# Patient Record
Sex: Female | Born: 1975 | Race: Black or African American | Hispanic: No | Smoking: Former smoker
Health system: Southern US, Community
[De-identification: ages and names within clinical notes are randomized; demographics above are authoritative.]

## PROBLEM LIST (undated history)

## (undated) DIAGNOSIS — J45909 Unspecified asthma, uncomplicated: Secondary | ICD-10-CM

## (undated) DIAGNOSIS — L309 Dermatitis, unspecified: Secondary | ICD-10-CM

## (undated) DIAGNOSIS — U071 COVID-19: Secondary | ICD-10-CM

## (undated) HISTORY — DX: Dermatitis, unspecified: L30.9

## (undated) HISTORY — DX: Unspecified asthma, uncomplicated: J45.909

---

## 1998-08-23 ENCOUNTER — Inpatient Hospital Stay (HOSPITAL_COMMUNITY): Admission: AD | Admit: 1998-08-23 | Discharge: 1998-08-23 | Payer: Self-pay | Admitting: *Deleted

## 1998-08-25 ENCOUNTER — Ambulatory Visit (HOSPITAL_COMMUNITY): Admission: RE | Admit: 1998-08-25 | Discharge: 1998-08-25 | Payer: Self-pay | Admitting: *Deleted

## 1999-12-14 ENCOUNTER — Other Ambulatory Visit: Admission: RE | Admit: 1999-12-14 | Discharge: 1999-12-14 | Payer: Self-pay | Admitting: Obstetrics and Gynecology

## 2000-06-27 ENCOUNTER — Encounter (INDEPENDENT_AMBULATORY_CARE_PROVIDER_SITE_OTHER): Payer: Self-pay | Admitting: Specialist

## 2000-06-27 ENCOUNTER — Inpatient Hospital Stay (HOSPITAL_COMMUNITY): Admission: AD | Admit: 2000-06-27 | Discharge: 2000-07-01 | Payer: Self-pay | Admitting: Obstetrics and Gynecology

## 2001-03-07 ENCOUNTER — Other Ambulatory Visit: Admission: RE | Admit: 2001-03-07 | Discharge: 2001-03-07 | Payer: Self-pay | Admitting: Obstetrics and Gynecology

## 2002-03-07 ENCOUNTER — Other Ambulatory Visit: Admission: RE | Admit: 2002-03-07 | Discharge: 2002-03-07 | Payer: Self-pay | Admitting: Obstetrics and Gynecology

## 2003-02-07 ENCOUNTER — Other Ambulatory Visit: Admission: RE | Admit: 2003-02-07 | Discharge: 2003-02-07 | Payer: Self-pay | Admitting: Obstetrics and Gynecology

## 2005-01-21 ENCOUNTER — Other Ambulatory Visit: Admission: RE | Admit: 2005-01-21 | Discharge: 2005-01-21 | Payer: Self-pay | Admitting: Obstetrics and Gynecology

## 2005-08-15 ENCOUNTER — Inpatient Hospital Stay (HOSPITAL_COMMUNITY): Admission: AD | Admit: 2005-08-15 | Discharge: 2005-08-15 | Payer: Self-pay | Admitting: *Deleted

## 2005-08-17 ENCOUNTER — Inpatient Hospital Stay (HOSPITAL_COMMUNITY): Admission: AD | Admit: 2005-08-17 | Discharge: 2005-08-20 | Payer: Self-pay | Admitting: Obstetrics and Gynecology

## 2005-08-18 ENCOUNTER — Encounter (INDEPENDENT_AMBULATORY_CARE_PROVIDER_SITE_OTHER): Payer: Self-pay | Admitting: Specialist

## 2005-08-27 ENCOUNTER — Inpatient Hospital Stay (HOSPITAL_COMMUNITY): Admission: AD | Admit: 2005-08-27 | Discharge: 2005-08-30 | Payer: Self-pay | Admitting: Obstetrics and Gynecology

## 2006-09-09 ENCOUNTER — Encounter: Admission: RE | Admit: 2006-09-09 | Discharge: 2006-09-09 | Payer: Self-pay | Admitting: Obstetrics and Gynecology

## 2008-08-09 HISTORY — PX: TENDON REPAIR: SHX5111

## 2009-01-31 ENCOUNTER — Emergency Department (HOSPITAL_COMMUNITY): Admission: EM | Admit: 2009-01-31 | Discharge: 2009-01-31 | Payer: Self-pay | Admitting: Emergency Medicine

## 2010-10-25 ENCOUNTER — Inpatient Hospital Stay (INDEPENDENT_AMBULATORY_CARE_PROVIDER_SITE_OTHER)
Admission: RE | Admit: 2010-10-25 | Discharge: 2010-10-25 | Disposition: A | Discharge status: Home / Self Care | Payer: 59 | Source: Ambulatory Visit | Attending: Family Medicine | Admitting: Family Medicine

## 2010-10-25 DIAGNOSIS — J069 Acute upper respiratory infection, unspecified: Secondary | ICD-10-CM

## 2010-11-16 LAB — POCT I-STAT 4, (NA,K, GLUC, HGB,HCT)
Glucose, Bld: 104 mg/dL — ABNORMAL HIGH (ref 70–99)
Potassium: 3.7 mEq/L (ref 3.5–5.1)
Sodium: 138 mEq/L (ref 135–145)

## 2010-12-22 NOTE — Op Note (Signed)
NAMEKATEENA, DEGROOTE                ACCOUNT NO.:  [id number]   MEDICAL RECORD NO.:  [id number]          PATIENT TYPE:  EMS   LOCATION:  MAJO                         FACILITY:  [facility]   PHYSICIAN:  Tennis Must Meyerdierks, M.D.DATE OF BIRTH:  Jun 15, 1976   DATE OF PROCEDURE:  01/31/2009  DATE OF DISCHARGE:  01/31/2009                               OPERATIVE REPORT   PREOPERATIVE DIAGNOSIS:  Laceration of right ring and long finger  extensor tendons, dorsum of the hand.   POSTOPERATIVE DIAGNOSES:  Laceration of extensor tendon, right long  finger with partial laceration of extensor tendon of right ring finger,  dorsum of the hand.   PROCEDURE:  Repair of extensor tendon of right long and ring fingers,  dorsum of the hand.   SURGEON:  Lowell Bouton, MD   ANESTHESIA:  General.   OPERATIVE FINDINGS:  The patient had a complete transection of the [facility]  of the long finger overlying the metacarpus.  The ring finger had 2  extensor tendons and one was lacerated 70% of the way through and the  other was lacerated 50% of the way through.   PROCEDURE:  Under general anesthesia with a tourniquet on the right arm,  the right hand was prepped and draped in usual fashion.  After elevating  the limb, the tourniquet was inflated to 250 mmHg.  The previous sutures  were removed, and the wound was copiously irrigated.  It was extended  distally with a knife.  Blunt dissection was carried down into the  subcutaneous tissues and the extensor tendons were identified.  The  proximal ends of the tendon of the long finger was easily identified and  a 3-0 Ethibond Kessler suture was inserted.  The long finger was then  extended and the distal end was identified.  The repair was performed  using a 3-0 Ethibond Kessler augmented with 4 simple sutures.  The ring  finger extensor tendon was then examined and had 2 slips.  The radial  slip was transected 70% of the way through and so a 3-0 Ethibond  Kessler  suture was inserted.  The ulnar slip was lacerated 50% of the way  through and so was repaired with a 3-0 Ethibond simple sutures.  The  wound was irrigated copiously with saline.  The skin was closed with 4-0  nylon sutures.  Marcaine 0.5% was placed in the skin edges for pain  control.  Sterile dressings were applied followed by a volar splint with  the fingers extended and the wrist extended.  The patient had the  tourniquet released with good circulation of the hand.  She went to the  recovery room awake, stable, and in good condition.      Lowell Bouton, M.D.  Electronically Signed     EMM/MEDQ  D:  01/31/2009  T:  02/01/2009  Job:  [id number]

## 2010-12-25 NOTE — Discharge Summary (Signed)
NAMEMARIJANE, [name]               ACCOUNT NO.:  [id number]   MEDICAL RECORD NO.:  [id number]           PATIENT TYPE:   LOCATION:                                 FACILITY:   PHYSICIAN:  Naima A. Dillard, M.D.      DATE OF BIRTH:   DATE OF ADMISSION:  08/17/2005  DATE OF DISCHARGE:  08/20/2005                                 DISCHARGE SUMMARY   ADMITTING DIAGNOSES:  1.  Intrauterine pregnancy at 40 weeks.  2.  Spontaneous rupture of membranes.  3.  Previous cesarean section with desire for vaginal birth after cesarean.  4.  Positive group B Strep.  5.  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   DISCHARGE DIAGNOSES:  1.  Intrauterine pregnancy at 40 weeks.  2.  Spontaneous rupture of membranes.  3.  Previous cesarean section with desire for vaginal birth after cesarean.  4.  Positive group B Strep.  5.  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  6.  Vaginal birth after cesarean of a viable female infant named Gerilyn Pilgrim      weighing 6 pounds 8 ounces, Apgars 9 and 9.  7.  Left periurethral first degree laceration.  8.  Status post bilateral tubal ligation.   HOSPITAL PROCEDURES:  1.  Maternal fetal monitoring.  2.  Spontaneous vaginal delivery (vaginal birth after cesarean).  3.  Repair of first degree laceration.  4.  Postpartum bilateral tubal ligation.  5.  Epidural.  6.  Pitocin augmentation of labor.   HOSPITAL COURSE:  Patient was admitted with ruptured membranes and Pitocin  augmentation of labor.  Patient progressed throughout the night and  developed some variable decelerations which were treated with Amnioinfusion  with success.  Patient progressed to complete dilation and pushed well to  VBAC, female infant weighing 6 pounds 8 ounces, Apgars 9 and 9.  First degree  laceration was repaired.  Patient was requesting a bilateral tubal ligation  which was performed the next morning under epidural anesthesia by Dr. Estanislado Pandy  with no complications.  On postpartum day #1  patient was doing well, up and  about, tolerating foods, and breast-feeding the baby.  On postpartum day #2  patient was ready to go home.  She was breast-feeding well.  Pain was well  controlled.  She was afebrile and vital signs were stable.  Chest was clear.  Heart rate regular rate and rhythm.  Abdomen soft and appropriately tender.  Incision was clean, dry, and intact.  Lochia was small.  Extremities within  normal limits.  She was deemed to have received full benefit of her hospital  stay, was discharged home.   DISCHARGE MEDICATIONS:  1.  Motrin 600 mg p.o. q.6h. p.r.n.  2.  Tylox one to two p.o. q.4h. p.r.n.   DISCHARGE LABORATORIES:  White blood cell count 9, hemoglobin 12.5,  platelets 177.   DISCHARGE INSTRUCTIONS:  Per [facility] handout.   DISCHARGE FOLLOW-UP:  Six weeks or p.r.n.      Marie L. Williams, C.N.M.      Naima A. Normand Sloop, M.D.  Electronically Signed  MLW/MEDQ  D:  08/20/2005  T:  08/20/2005  Job:  [id number]

## 2010-12-25 NOTE — Discharge Summary (Signed)
[facility]  Patient:    [name]                        MRN: [id number] Adm. Date:  [id number] Disc. Date: [id number] Attending:  Shaune Spittle Dictator:   Miguel Dibble, C.N.M.                           Discharge Summary  ADMISSION DIAGNOSES:          1. Intrauterine pregnancy at term.                               2. Spontaneous rupture of membranes.                               3. Negative Group beta strep.                               4. Early labor.  DISCHARGE DIAGNOSES:          1. Nonreassuring fetal heart tones.                               2. Prolonged rupture of membranes.                               3. Delivered viable baby boy by cesarean                                  section, Apgars 3 and 9.  PROCEDURES:                   1. Epidural anesthesia.                               2. Internal fetal monitoring.                               3. Pitocin augmentation.                               4. Amnioinfusion.  COURSE OF HOSPITALIZATION:    Upon admission on November 19 at approximately 9:30 a.m. [name] was admitted after having ruptured five hours earlier. At her last office examination her cervix was one cm and 50% effaced. She was contracting mildly. By 1315 her cervix was 1.5 cm, 70% to 80% effaced, vertex at -2. Her forebag was ruptured. Pitocin augmentation was discussed an initiated at 1615 for inadequate contractions. By 5784 her cervix had progressed to 3 cm, 90% effaced, vertex at -1. Fetal heart rate was reassuring with a negative CST and contractions every three to four minutes. At 2200 hours she had progressed to 4 cm, 90% effaced, vertex at -1 with small accelerations and several early decelerations, occasionally late in timing. A fetal scalp electrode and IUPC were placed indicating 190 [place] units per 10 minutes. Cervix was 4 cm. At 0020 she  had progressed to 4 to 5 cm, 90% effaced,  vertex at -1, 200 to 220 [place] units. Fetal heart rate was nonreactive after Stadol but no decelerations. By 1:30 a.m. Pitocin was turned off for several variable decelerations down to the 100s and an episode of repetitive lates for 15 minutes that recovered with oxygen and IV bolus. Her cervix remained 5 cm, 90% effaced, vertex at -1. Fetal scalp stimulation was applied with reactivity. At 2:30 a.m. late decelerations had resolved; however, there was little long-term variability. An amnioinfusion had already been performed. The uterine baseline had increased between contractions. By 4:45 a.m. resting tone had improved but contractions continued. Fetal heart rate was in the 150s to 160s with subtle decelerations. Her cervix remained 5 cm, was swollen, and vertex at -1 despite Pitocin augmentation. After much discussion with the patient and her partner, it was decided we ought to proceed with a cesarean section. When fetal heart rate deteriorated it was persistent lates and resting tone between contractions rising. She had a viable baby boy, [name], Apgars 3 and 9, weighing 7 pounds, by cesarean section, lower segment transverse incision under epidural anesthesia without complications.  On November 21, postop day #1, she was breast-feeding, voiding, desired Depo-Provera for contraception, was afebrile. Hemoglobin dropped from 13.1 to 9.9. Incision was clean, dry, and intact.  On postop day #2 she was afebrile, ambulating well, tolerating her regular diet, breast-feeding well.  On postop day #3, November 23, she continues to breast-feed well, incision is clean, dry, and intact, lochia scant, fundus firm. She was discharged home in stable condition after her injection of Depo-Provera.  DISCHARGE INSTRUCTIONS:       Discharge instructions per [facility] booklet.  DISCHARGE MEDICATIONS:        Motrin, Tylox, prenatal vitamins, over-the-counter iron. Depo-Provera to be  given in the hospital.  DISCHARGE FOLLOW-UP:          Discharge follow-up in six weeks at [facility]. DD:  07/02/00 TD:  07/02/00 Job: [id number]
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
## 2010-12-25 NOTE — Op Note (Signed)
[facility]  Patient:    [name], [name]                        MRN: [id number] Proc. Date: 06/28/00 Adm. Date:  [id number] Disc. Date: [id number] Attending:  Dierdre Forth Pearline                           Operative Report  PREOPERATIVE DIAGNOSES:       1. Prolonged rupture of membranes.                               2. Fetal late decelerations.  POSTOPERATIVE DIAGNOSES:      1. Prolonged rupture of membranes.                               2. Fetal late decelerations.  SURGEON:                      Cecilio Asper, M.D.  ASSISTANT:                    Wynelle Bourgeois, CNM  PROCEDURE:                    Low transverse cesarean delivery.  ANESTHESIA:                   Epidural.  ESTIMATED BLOOD LOSS:         600 cc.  URINE OUTPUT:                 175 cc.  FINDINGS:                     Viable female infant named Jahri, Apgars 3 and 9, weighing 7 pounds.  Normal tubes and ovaries.  Placenta to pathology.  COMPLICATIONS:                None.  HISTORY:                      The patient is a 35 year old gravida 3, para 0, who presented at 49 weeks with spontaneous rupture of membranes on June 27, 2000.  The patient was augmented with Pitocin and progressed to 4-5 cm of dilatation.  Following epidural anesthesia, the patient was noted to have late decelerations.  The lates spontaneously resolved status post amnioinfusion. Contraction baseline had increased; therefore, one dose of subcutaneous terbutaline was given after Pitocin and amnioinfusion were discontinued to allow uterus to rest appropriately.  Once the resting time improved, the contractions were noted to continue.  The fetal heart tracing sustained persistent late decelerations after reinstitution of Pitocin.  The patient was therefore consented for a cesarean delivery.  DESCRIPTION OF PROCEDURE:     After adequate level of epidural anesthesia was obtained, the patient was  prepped and draped in a sterile fashion.  Low transverse incision was made down to the subcutaneous fat to the fascia. Fascia was incised on either side of the midline.  Kocher clamps were placed on the superior edge of the incision and the fascia was removed from the rectus muscles, both superiorly and inferiorly.  Parietal peritoneum was grasped and incised with the Metzenbaum scissors, and this incision was extended bluntly.  Bladder  piece was placed inside of the incision.  The uterus was entered in a low transverse cesarean fashion and extended laterally in both directions with the bandage scissors.  The infants head was elevated with fundal pressure.  The infant was delivered and taken to the warmer where it was cared for by the pediatric team.  Cord blood was obtained.  Placenta was manually extracted.  Uterus was removed from the pelvic abdominal region and covered with a wet lap sponge.  Tubes and ovaries were noted to be normal. The uterus was wiped clean with the wet lap sponge.  The uterine incision was grasped with spring clamps and reapproximated with a running suture of 0 Vicryl, second embrocating layer of 0 Vicryl was used with good hemostatic result.  The uterus was then replaced inside the incision.  Pelvis was irrigated.  Parietal peritoneum was mainly reapproximated.  The fascia was reapproximated from lateral edge of the midline with a running suture of #1 Vicryl.  Subcuticular layer was irrigated.  Skin was closed with skin staples. The patient tolerated the procedure well.  She was taken to recovery room in stable condition. DD:  09/06/00 TD:  09/06/00 Job: [id number]
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
## 2010-12-25 NOTE — Discharge Summary (Signed)
[name], [name]               ACCOUNT NO.:  [id number]   MEDICAL RECORD NO.:  [id number]          PATIENT TYPE:  INP   LOCATION:  9319                          [facility]ACILITY:  [facility]   PHYSICIAN:  [name], M.D.DATE OF BIRTH:  12/14/1975   DATE OF ADMISSION:  08/27/2005  DATE OF DISCHARGE:  08/30/2005                                 DISCHARGE SUMMARY   ADMITTING DIAGNOSES:  1.  Ten days postpartum status post vaginal birth after cesarean.  2.  Preeclampsia.   DISCHARGE DIAGNOSES:  1.  Postpartum x12 days.  2.  Resolving preeclampsia.   PROCEDURES:  Magnesium sulfate therapy.   HOSPITAL COURSE:  [name] is a 35 year old gravida 4, para 2-0-2-2, who  was seen in the office on August 27, 2005 with a report from the smart-  start nurse of blood pressures in the 140s/100s.  This was confirmed in the  office.  She also had proteinuria on a voided specimen.  She denied any  other symptoms.  She was sent to Maternity Admissions where she was noted to  have 30 mg of protein on a cath specimen, a slightly elevated SGPT and uric  acid of 6.8.  Her pressures in Maternity Admissions were in the 150s-  160s/101-110.  She was therefore admitted for magnesium sulfate therapy and  a 24-hour urine.  SGOT and SGPT had come down some after 24 hours.  Her  blood pressures were improving with diastolics from 75 to 100.  She had a 24-  hour urine showing greater than 1,000 mg of protein.  Repeat labs were  normal.  By August 30, 2005, the patient was doing well.  Her blood  pressures were in the 130s/88-99.  Her weight was 123, which was down two to  three pounds from admission.  Her physical exam was within normal limits.  Her urine output was good.  She denied any headache, visual symptoms or  epigastric pain.  Her postpartum recovery was continuing to go well.  She  was deemed to have received the full benefit of her hospital stay and was  discharged home per consult with Dr. Marline Gould.   DISCHARGE INSTRUCTIONS:  The patient is monitor for PIH precautions.  She is  also to increase her rest this week.  Routine postpartum instructions will  also be continued.   DISCHARGE MEDICATIONS:  Prenatal vitamins one p.o. daily.   Discharge follow up will occur on September 03, 2005 at 12 noon at [facility] in the office with [name], C.N.M. for a postpartum blood  pressure check or p.r.n.      [name], C.N.M.      [name], M.D.  Electronically Signed    VLL/MEDQ  D:  08/30/2005  T:  08/30/2005  Job:  [id number]
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
## 2010-12-25 NOTE — Op Note (Signed)
[name], [name]               ACCOUNT NO.:  [id number]   MEDICAL RECORD NO.:  [id number]          PATIENT TYPE:  INP   LOCATION:  9138                          FACILITY:  [facility]   PHYSICIAN:  Crist Fat. Rivard, M.D. DATE OF BIRTH:  Jan 14, 1976   DATE OF PROCEDURE:  08/18/2005  DATE OF DISCHARGE:                                 OPERATIVE REPORT   PREOPERATIVE DIAGNOSIS:  Desire for sterilization.   POSTOPERATIVE DIAGNOSIS:  Desire for sterilization.   ANESTHESIA:  Epidural.   PROCEDURE:  Postpartum bilateral tubal ligation.   SURGEON:  Crist Fat. Rivard, M.D.   ESTIMATED BLOOD LOSS:  Minimal.   PROCEDURE:  After being informed of the planned procedure with possible  complications including bleeding, infection and injury to other organs as  well as irreversibility and failure rate of one in 500, informed consent is  obtained.  The patient is taken to OR #1 and preexisting epidural anesthesia  is reinforced.  The patient is then placed in a dorsal decubitus position,  prepped and draped in a sterile fashion and her bladder is emptied with an  in-and-out Foley catheter.  After assessing adequate level of anesthesia,  the umbilical area is infiltrated with Marcaine 0.25% 10 mL, and we perform  a semielliptical incision at the umbilicus.  This is brought down bluntly to  the fascia, fascia is identified and grasped with Kocher forceps and is  incised with Mayo scissors.  The peritoneum is identified and entered  bluntly.  We are then able to easily locate each tube, which is exteriorized  until the fimbriae are identified.  The mesosalpinx is entered with cautery.  The tube is doubly ligated in the proximal and distal stump with 0 chromic.  A section of 1.5 cm is excised and both stumps are cauterized.  Hemostasis  is adequate.  The fascia is then closed with a running suture of 0 Vicryl  and skin is closed with a subcuticular suture of 3-0 Monocryl and Steri-  Strips.   Instrument  and sponge count is complete x2.  Estimated blood loss is  minimal.  The procedures very well-tolerated by the patient, who is taken to  recovery room in a well and stable condition.      Crist Fat Rivard, M.D.  Electronically Signed     SAR/MEDQ  D:  08/18/2005  T:  08/18/2005  Job:  [id number]

## 2012-08-31 ENCOUNTER — Other Ambulatory Visit: Payer: Self-pay | Admitting: Internal Medicine

## 2012-08-31 DIAGNOSIS — N6311 Unspecified lump in the right breast, upper outer quadrant: Secondary | ICD-10-CM

## 2012-08-31 DIAGNOSIS — N6315 Unspecified lump in the right breast, overlapping quadrants: Secondary | ICD-10-CM

## 2012-09-08 ENCOUNTER — Ambulatory Visit
Admission: RE | Admit: 2012-09-08 | Discharge: 2012-09-08 | Disposition: A | Discharge status: Home / Self Care | Payer: 59 | Source: Ambulatory Visit | Attending: Internal Medicine | Admitting: Internal Medicine

## 2012-09-08 DIAGNOSIS — N6311 Unspecified lump in the right breast, upper outer quadrant: Secondary | ICD-10-CM

## 2012-09-08 DIAGNOSIS — N6315 Unspecified lump in the right breast, overlapping quadrants: Secondary | ICD-10-CM

## 2013-05-10 ENCOUNTER — Other Ambulatory Visit: Payer: Self-pay | Admitting: Internal Medicine

## 2013-05-10 DIAGNOSIS — N6310 Unspecified lump in the right breast, unspecified quadrant: Secondary | ICD-10-CM

## 2013-05-14 ENCOUNTER — Other Ambulatory Visit: Payer: 59

## 2013-05-15 ENCOUNTER — Ambulatory Visit
Admission: RE | Admit: 2013-05-15 | Discharge: 2013-05-15 | Disposition: A | Discharge status: Home / Self Care | Payer: 59 | Source: Ambulatory Visit | Attending: Internal Medicine | Admitting: Internal Medicine

## 2013-05-15 DIAGNOSIS — N6310 Unspecified lump in the right breast, unspecified quadrant: Secondary | ICD-10-CM

## 2014-06-17 ENCOUNTER — Other Ambulatory Visit: Payer: Self-pay | Admitting: Internal Medicine

## 2014-06-17 DIAGNOSIS — N6310 Unspecified lump in the right breast, unspecified quadrant: Secondary | ICD-10-CM

## 2014-06-28 ENCOUNTER — Ambulatory Visit
Admission: RE | Admit: 2014-06-28 | Discharge: 2014-06-28 | Disposition: A | Discharge status: Home / Self Care | Payer: 59 | Source: Ambulatory Visit | Attending: Internal Medicine | Admitting: Internal Medicine

## 2014-06-28 DIAGNOSIS — N6310 Unspecified lump in the right breast, unspecified quadrant: Secondary | ICD-10-CM

## 2016-07-14 ENCOUNTER — Other Ambulatory Visit: Payer: Self-pay | Admitting: Internal Medicine

## 2016-07-14 DIAGNOSIS — Z1231 Encounter for screening mammogram for malignant neoplasm of breast: Secondary | ICD-10-CM

## 2016-08-03 ENCOUNTER — Ambulatory Visit: Payer: Self-pay

## 2016-09-17 ENCOUNTER — Encounter: Payer: Self-pay | Admitting: Radiology

## 2016-09-17 ENCOUNTER — Ambulatory Visit
Admission: RE | Admit: 2016-09-17 | Discharge: 2016-09-17 | Disposition: A | Discharge status: Home / Self Care | Payer: BLUE CROSS/BLUE SHIELD | Source: Ambulatory Visit | Attending: Internal Medicine | Admitting: Internal Medicine

## 2016-09-17 DIAGNOSIS — Z1231 Encounter for screening mammogram for malignant neoplasm of breast: Secondary | ICD-10-CM

## 2017-12-05 ENCOUNTER — Other Ambulatory Visit: Payer: Self-pay | Admitting: Internal Medicine

## 2017-12-05 DIAGNOSIS — Z1231 Encounter for screening mammogram for malignant neoplasm of breast: Secondary | ICD-10-CM

## 2017-12-08 ENCOUNTER — Other Ambulatory Visit: Payer: Self-pay | Admitting: Internal Medicine

## 2017-12-08 DIAGNOSIS — N644 Mastodynia: Secondary | ICD-10-CM

## 2017-12-12 ENCOUNTER — Other Ambulatory Visit: Payer: BLUE CROSS/BLUE SHIELD

## 2017-12-13 ENCOUNTER — Other Ambulatory Visit: Payer: BLUE CROSS/BLUE SHIELD

## 2017-12-19 ENCOUNTER — Ambulatory Visit: Payer: BLUE CROSS/BLUE SHIELD

## 2017-12-19 ENCOUNTER — Ambulatory Visit
Admission: RE | Admit: 2017-12-19 | Discharge: 2017-12-19 | Disposition: A | Discharge status: Home / Self Care | Payer: BLUE CROSS/BLUE SHIELD | Source: Ambulatory Visit | Attending: Internal Medicine | Admitting: Internal Medicine

## 2017-12-19 DIAGNOSIS — N644 Mastodynia: Secondary | ICD-10-CM

## 2018-04-24 DIAGNOSIS — Z0000 Encounter for general adult medical examination without abnormal findings: Secondary | ICD-10-CM | POA: Diagnosis not present

## 2018-04-24 DIAGNOSIS — N390 Urinary tract infection, site not specified: Secondary | ICD-10-CM | POA: Diagnosis not present

## 2018-04-24 DIAGNOSIS — R635 Abnormal weight gain: Secondary | ICD-10-CM | POA: Diagnosis not present

## 2018-10-10 ENCOUNTER — Ambulatory Visit (INDEPENDENT_AMBULATORY_CARE_PROVIDER_SITE_OTHER): Payer: BLUE CROSS/BLUE SHIELD

## 2018-10-10 ENCOUNTER — Encounter: Payer: Self-pay | Admitting: Emergency Medicine

## 2018-10-10 ENCOUNTER — Other Ambulatory Visit: Payer: Self-pay

## 2018-10-10 ENCOUNTER — Ambulatory Visit (INDEPENDENT_AMBULATORY_CARE_PROVIDER_SITE_OTHER): Payer: BLUE CROSS/BLUE SHIELD | Admitting: Emergency Medicine

## 2018-10-10 VITALS — BP 117/77 | HR 103 | Temp 98.0°F | Resp 16 | Ht 59.75 in | Wt 120.8 lb

## 2018-10-10 DIAGNOSIS — J22 Unspecified acute lower respiratory infection: Secondary | ICD-10-CM | POA: Diagnosis not present

## 2018-10-10 DIAGNOSIS — R091 Pleurisy: Secondary | ICD-10-CM

## 2018-10-10 DIAGNOSIS — R05 Cough: Secondary | ICD-10-CM

## 2018-10-10 DIAGNOSIS — R059 Cough, unspecified: Secondary | ICD-10-CM | POA: Insufficient documentation

## 2018-10-10 LAB — POC INFLUENZA A&B (BINAX/QUICKVUE)
INFLUENZA A, POC: NEGATIVE
Influenza B, POC: NEGATIVE

## 2018-10-10 MED ORDER — PREDNISONE 20 MG PO TABS: 40.0000 mg | ORAL_TABLET | Freq: Every day | ORAL | 0 refills | Status: AC

## 2018-10-10 MED ORDER — BENZONATATE 200 MG PO CAPS: 200.0000 mg | ORAL_CAPSULE | Freq: Two times a day (BID) | ORAL | 0 refills | Status: DC | PRN

## 2018-10-10 MED ORDER — ALBUTEROL SULFATE HFA 108 (90 BASE) MCG/ACT IN AERS
2.0000 | INHALATION_SPRAY | Freq: Four times a day (QID) | RESPIRATORY_TRACT | 0 refills | Status: DC | PRN
Start: 2018-10-10 — End: 2019-03-08

## 2018-10-10 MED ORDER — AZITHROMYCIN 250 MG PO TABS: ORAL_TABLET | ORAL | 0 refills | Status: DC

## 2018-10-10 NOTE — Progress Notes (Signed)
[name] 43 y.o.   Chief Complaint  Patient presents with  . Cough    nonproductive with SOB x 2-3 weeks it is hard to breath when lying down    HISTORY OF PRESENT ILLNESS: This is a 43 y.o. female complaining of dry cough and flulike symptoms that started 2 to 3 weeks ago.  Non-smoker.  No history of asthma or COPD.  Also complaining of a pleuritic left-sided chest pain.  Symptoms are worse when lying down.  Has had intermittent fever and chills.  No other significant symptoms.  No recent traveling.  HPI   Prior to Admission medications   Medication Sig Start Date End Date Taking? Authorizing Provider  Chlorphen-Phenyleph-ASA (ALKA-SELTZER PLUS COLD PO) Take by mouth as needed.   Yes [provider]    Allergies  Allergen Reactions  . Shellfish Allergy Anaphylaxis, Hives and Itching  . Iodine Itching    hives  . Percocet [Oxycodone-Acetaminophen] Itching and Rash    There are no active problems to display for this patient.   No past medical history on file.  No past surgical history on file.  Social History   Socioeconomic History  . Marital status: Married    Spouse name: Not on file  . Number of children: Not on file  . Years of education: Not on file  . Highest education level: Not on file  Occupational History  . Not on file  Social Needs  . Financial resource strain: Not on file  . Food insecurity:    Worry: Not on file    Inability: Not on file  . Transportation needs:    Medical: Not on file    Non-medical: Not on file  Tobacco Use  . Smoking status: Former Smoker    Years: 20.00    Types: Cigarettes  . Smokeless tobacco: Never Used  Substance and Sexual Activity  . Alcohol use: Not on file    Comment: sometimes  . Drug use: Never  . Sexual activity: Not on file  Lifestyle  . Physical activity:    Days per week: Not on file    Minutes per session: Not on file  . Stress: Not on file  Relationships  . Social connections:   Talks on phone: Not on file    Gets together: Not on file    Attends religious service: Not on file    Active member of club or organization: Not on file    Attends meetings of clubs or organizations: Not on file    Relationship status: Not on file  . Intimate partner violence:    Fear of current or ex partner: Not on file    Emotionally abused: Not on file    Physically abused: Not on file    Forced sexual activity: Not on file  Other Topics Concern  . Not on file  Social History Narrative  . Not on file    No family history on file.   Review of Systems  Constitutional: Positive for chills and fever.  HENT: Negative.   Respiratory: Positive for cough and shortness of breath. Negative for hemoptysis, sputum production and wheezing.   Cardiovascular: Positive for chest pain.  Gastrointestinal: Negative.  Negative for abdominal pain, nausea and vomiting.  Genitourinary: Negative.   Musculoskeletal: Negative for back pain, myalgias and neck pain.  Skin: Negative.   Neurological: Negative.   Endo/Heme/Allergies: Negative.   All other systems reviewed and are negative.  Vitals:   10/10/18 8016  BP: 117/77  Pulse: (!) 103  Resp: 16  Temp: 98 F (36.7 C)  SpO2: 96%     Physical Exam Vitals signs reviewed.  Constitutional:      Appearance: Normal appearance.  HENT:     Head: Normocephalic and atraumatic.     Nose: Nose normal.     Mouth/Throat:     Mouth: Mucous membranes are moist.     Pharynx: Oropharynx is clear.  Eyes:     Extraocular Movements: Extraocular movements intact.     Conjunctiva/sclera: Conjunctivae normal.     Pupils: Pupils are equal, round, and reactive to light.  Neck:     Musculoskeletal: Normal range of motion and neck supple.  Cardiovascular:     Rate and Rhythm: Regular rhythm. Tachycardia present.     Heart sounds: Normal heart sounds.  Pulmonary:     Effort: Pulmonary effort is normal.     Breath sounds: Normal breath sounds.    Abdominal:     Palpations: Abdomen is soft.     Tenderness: There is no abdominal tenderness.  Musculoskeletal: Normal range of motion.  Lymphadenopathy:     Cervical: No cervical adenopathy.  Skin:    General: Skin is warm and dry.     Capillary Refill: Capillary refill takes less than 2 seconds.  Neurological:     General: No focal deficit present.     Mental Status: She is alert and oriented to person, place, and time.  Psychiatric:        Mood and Affect: Mood normal.        Behavior: Behavior normal.    Dg Chest 2 View  Result Date: 10/10/2018 CLINICAL DATA:  Cough. EXAM: CHEST - 2 VIEW COMPARISON:  None. FINDINGS: The lungs are clear. Heart size is normal. No pneumothorax or pleural fluid. No acute or focal bony abnormality. IMPRESSION: No acute disease. Electronically Signed   By: Drusilla Kanner M.D.   On: 10/10/2018 09:26   Results for orders placed or performed in visit on 10/10/18 (from the past 24 hour(s))  POC Influenza A&B(BINAX/QUICKVUE)     Status: None   Collection Time: 10/10/18  9:32 AM  Result Value Ref Range   Influenza A, POC Negative Negative   Influenza B, POC Negative Negative     ASSESSMENT & PLAN: [name] was seen today for cough.  Diagnoses and all orders for this visit:  Cough -     albuterol (PROVENTIL HFA;VENTOLIN HFA) 108 (90 Base) MCG/ACT inhaler; Inhale 2 puffs into the lungs every 6 (six) hours as needed for wheezing or shortness of breath. -     benzonatate (TESSALON) 200 MG capsule; Take 1 capsule (200 mg total) by mouth 2 (two) times daily as needed for cough.  Lower respiratory infection -     CBC with Differential/Platelet -     Comprehensive metabolic panel -     POC Influenza A&B(BINAX/QUICKVUE) -     DG Chest 2 View; Future -     azithromycin (ZITHROMAX) 250 MG tablet; Sig as indicated  Pleurisy -     predniSONE (DELTASONE) 20 MG tablet; Take 2 tablets (40 mg total) by mouth daily with breakfast for 5 days.    Patient  Instructions       If you have lab work done today you will be contacted with your lab results within the next 2 weeks.  If you have not heard from [place] then please contact us. The fastest way to get your results  is to register for My Chart.   IF you received an x-ray today, you will receive an invoice from [facility]. Please contact [facility] at [phone] with questions or concerns regarding your invoice.   IF you received labwork today, you will receive an invoice from [place]. Please contact [facility] at [phone] with questions or concerns regarding your invoice.   Our billing staff will not be able to assist you with questions regarding bills from these companies.  You will be contacted with the lab results as soon as they are available. The fastest way to get your results is to activate your My Chart account. Instructions are located on the last page of this paperwork. If you have not heard from [place] regarding the results in 2 weeks, please contact this office.     Pleurisy Pleurisy is irritation and swelling (inflammation) of the linings of your lungs (pleura). This can cause pain in your chest, back, or shoulder. It can also cause trouble breathing. Follow these instructions at home: Medicines  Take over-the-counter and prescription medicines only as told by your doctor.  If you were prescribed antibiotic medicine, take it as told by your doctor. Do not stop taking the antibiotic even if you start to feel better. Activity  Rest and return to your normal activities as told by your doctor. Ask your doctor what activities are safe for you.  Do not drive or use heavy machinery while taking prescription pain medicine. General instructions   Watch for any changes in your condition.  Take deep breaths often, even if it is painful. This can help prevent lung problems.  When lying down, lie on your painful side. This may help you feel less pain.  Do not  smoke. If you need help quitting, ask your doctor.  Keep all follow-up visits as told by your doctor. This is important. Contact a doctor if:  You have pain that: ? Gets worse. ? Does not get better with medicine. ? Lasts for more than 1 week.  You have a fever or chills.  You have a cough that does not get better at home.  You have trouble breathing that does not get better at home.  You cough up liquid that looks like pus (purulent secretions). Get help right away if:  Your lips, fingernails, or toenails turn dark or turn blue.  You cough up blood.  You have trouble breathing that gets worse.  You are making loud noises when you breathe (wheezing) and this gets worse.  You have pain that spreads to your neck, arms, or jaw.  You get a rash.  You throw up (vomit).  You pass out (faint). Summary  Pleurisy is irritation and swelling (inflammation) of the linings of your lungs (pleura).  Pleurisy can cause pain and trouble breathing.  If you have a cough that does not get better at home, contact your doctor.  Get help right away if you are having trouble breathing and it is getting worse. This information is not intended to replace advice given to you by your health care provider. Make sure you discuss any questions you have with your health care provider. Document Released: 07/08/2008 Document Revised: 04/19/2016 Document Reviewed: 04/19/2016 Elsevier Interactive Patient Education  2019 Elsevier Inc.      Edwina Barth, MD [facility]

## 2018-10-10 NOTE — Patient Instructions (Addendum)
If you have lab work done today you will be contacted with your lab results within the next 2 weeks.  If you have not heard from [place] then please contact us. The fastest way to get your results is to register for My Chart.   IF you received an x-ray today, you will receive an invoice from [facility]. Please contact [facility] at [phone] with questions or concerns regarding your invoice.   IF you received labwork today, you will receive an invoice from [place]. Please contact [facility] at [phone] with questions or concerns regarding your invoice.   Our billing staff will not be able to assist you with questions regarding bills from these companies.  You will be contacted with the lab results as soon as they are available. The fastest way to get your results is to activate your My Chart account. Instructions are located on the last page of this paperwork. If you have not heard from [place] regarding the results in 2 weeks, please contact this office.     Pleurisy Pleurisy is irritation and swelling (inflammation) of the linings of your lungs (pleura). This can cause pain in your chest, back, or shoulder. It can also cause trouble breathing. Follow these instructions at home: Medicines  Take over-the-counter and prescription medicines only as told by your doctor.  If you were prescribed antibiotic medicine, take it as told by your doctor. Do not stop taking the antibiotic even if you start to feel better. Activity  Rest and return to your normal activities as told by your doctor. Ask your doctor what activities are safe for you.  Do not drive or use heavy machinery while taking prescription pain medicine. General instructions   Watch for any changes in your condition.  Take deep breaths often, even if it is painful. This can help prevent lung problems.  When lying down, lie on your painful side. This may help you feel less pain.  Do not smoke. If you  need help quitting, ask your doctor.  Keep all follow-up visits as told by your doctor. This is important. Contact a doctor if:  You have pain that: ? Gets worse. ? Does not get better with medicine. ? Lasts for more than 1 week.  You have a fever or chills.  You have a cough that does not get better at home.  You have trouble breathing that does not get better at home.  You cough up liquid that looks like pus (purulent secretions). Get help right away if:  Your lips, fingernails, or toenails turn dark or turn blue.  You cough up blood.  You have trouble breathing that gets worse.  You are making loud noises when you breathe (wheezing) and this gets worse.  You have pain that spreads to your neck, arms, or jaw.  You get a rash.  You throw up (vomit).  You pass out (faint). Summary  Pleurisy is irritation and swelling (inflammation) of the linings of your lungs (pleura).  Pleurisy can cause pain and trouble breathing.  If you have a cough that does not get better at home, contact your doctor.  Get help right away if you are having trouble breathing and it is getting worse. This information is not intended to replace advice given to you by your health care provider. Make sure you discuss any questions you have with your health care provider. Document Released: 07/08/2008 Document Revised: 04/19/2016 Document Reviewed: 04/19/2016 Elsevier Interactive Patient Education  2019 Elsevier  Inc.  

## 2018-10-11 LAB — CBC WITH DIFFERENTIAL/PLATELET
BASOS: 1 %
Basophils Absolute: 0.1 10*3/uL (ref 0.0–0.2)
EOS (ABSOLUTE): 0.6 10*3/uL — ABNORMAL HIGH (ref 0.0–0.4)
Eos: 9 %
HEMOGLOBIN: 14 g/dL (ref 11.1–15.9)
Hematocrit: 43.5 % (ref 34.0–46.6)
Immature Grans (Abs): 0 10*3/uL (ref 0.0–0.1)
Immature Granulocytes: 0 %
LYMPHS: 27 %
Lymphocytes Absolute: 1.7 10*3/uL (ref 0.7–3.1)
MCH: 30.3 pg (ref 26.6–33.0)
MCHC: 32.2 g/dL (ref 31.5–35.7)
MCV: 94 fL (ref 79–97)
MONOCYTES: 9 %
Monocytes Absolute: 0.6 10*3/uL (ref 0.1–0.9)
NEUTROS ABS: 3.4 10*3/uL (ref 1.4–7.0)
NEUTROS PCT: 54 %
Platelets: 375 10*3/uL (ref 150–450)
RBC: 4.62 x10E6/uL (ref 3.77–5.28)
RDW: 11.7 % (ref 11.7–15.4)
WBC: 6.3 10*3/uL (ref 3.4–10.8)

## 2018-10-11 LAB — COMPREHENSIVE METABOLIC PANEL
ALBUMIN: 4.4 g/dL (ref 3.8–4.8)
ALK PHOS: 99 IU/L (ref 39–117)
ALT: 16 IU/L (ref 0–32)
AST: 21 IU/L (ref 0–40)
Albumin/Globulin Ratio: 1.8 (ref 1.2–2.2)
BUN / CREAT RATIO: 9 (ref 9–23)
BUN: 7 mg/dL (ref 6–24)
Bilirubin Total: 0.3 mg/dL (ref 0.0–1.2)
CHLORIDE: 101 mmol/L (ref 96–106)
CO2: 20 mmol/L (ref 20–29)
Calcium: 9.4 mg/dL (ref 8.7–10.2)
Creatinine, Ser: 0.81 mg/dL (ref 0.57–1.00)
GFR calc Af Amer: 102 mL/min/{1.73_m2} (ref >59)
GFR calc non Af Amer: 89 mL/min/{1.73_m2} (ref >59)
GLOBULIN, TOTAL: 2.4 g/dL (ref 1.5–4.5)
Glucose: 103 mg/dL — ABNORMAL HIGH (ref 65–99)
Potassium: 4.3 mmol/L (ref 3.5–5.2)
Sodium: 135 mmol/L (ref 134–144)
Total Protein: 6.8 g/dL (ref 6.0–8.5)

## 2018-10-23 ENCOUNTER — Ambulatory Visit: Payer: BLUE CROSS/BLUE SHIELD | Admitting: Internal Medicine

## 2018-10-27 ENCOUNTER — Ambulatory Visit: Payer: BLUE CROSS/BLUE SHIELD | Admitting: Internal Medicine

## 2018-12-18 ENCOUNTER — Telehealth: Payer: Self-pay | Admitting: Internal Medicine

## 2018-12-18 ENCOUNTER — Encounter: Payer: Self-pay | Admitting: Nurse Practitioner

## 2018-12-18 ENCOUNTER — Other Ambulatory Visit: Payer: Self-pay

## 2018-12-18 ENCOUNTER — Ambulatory Visit (INDEPENDENT_AMBULATORY_CARE_PROVIDER_SITE_OTHER): Payer: BLUE CROSS/BLUE SHIELD | Admitting: Nurse Practitioner

## 2018-12-18 DIAGNOSIS — R05 Cough: Secondary | ICD-10-CM | POA: Diagnosis not present

## 2018-12-18 DIAGNOSIS — J0110 Acute frontal sinusitis, unspecified: Secondary | ICD-10-CM

## 2018-12-18 DIAGNOSIS — M542 Cervicalgia: Secondary | ICD-10-CM

## 2018-12-18 DIAGNOSIS — R059 Cough, unspecified: Secondary | ICD-10-CM

## 2018-12-18 MED ORDER — BENZONATATE 200 MG PO CAPS: 200.0000 mg | ORAL_CAPSULE | Freq: Two times a day (BID) | ORAL | 0 refills | Status: DC | PRN

## 2018-12-18 MED ORDER — AMOXICILLIN-POT CLAVULANATE 875-125 MG PO TABS: 1.0000 | ORAL_TABLET | Freq: Two times a day (BID) | ORAL | 0 refills | Status: AC

## 2018-12-18 NOTE — Progress Notes (Signed)
Virtual Visit via video   This visit type was conducted due to national recommendations for restrictions regarding the COVID-19 Pandemic (e.g. social distancing) in an effort to limit this patient's exposure and mitigate transmission in our community.  Patients identity confirmed using two different identifiers.  This format is felt to be most appropriate for this patient at this time.  All issues noted in this document were discussed and addressed.  No physical exam was performed (except for noted visual exam findings with Video Visits).    Date:  12/18/2018   ID:  [name], DOB 05-Nov-1975, MRN [id number]  Patient Location:  Home - spoke with [name]  Provider location:   Office    Chief Complaint:  Questionable sinus  History of Present Illness:    [name] is a 43 y.o. female who presents via video conferencing for a telehealth visit today.    The patient does not have symptoms concerning for COVID-19 infection (fever, chills, cough, or new shortness of breath).   She had pleurisy in March   Yesterday she felt a little dizzy in the last day.  She is taking Mucinex-DM She also having nasal stuffiness  She works at [employer].    Neck Pain   This is a new problem. The current episode started 1 to 4 weeks ago.  Cough  Pertinent negatives include no chills or sore throat.  Sinus Problem  This is a new problem. The current episode started 1 to 4 weeks ago. The problem has been gradually worsening since onset. There has been no fever. Associated symptoms include congestion, coughing and neck pain. Pertinent negatives include no chills or sore throat. The treatment provided mild relief.     No past medical history on file. Past Surgical History:  Procedure Laterality Date  . CESAREAN SECTION  2001  . TENDON REPAIR  2010     Current Meds  Medication Sig  . albuterol (PROVENTIL HFA;VENTOLIN HFA) 108 (90 Base) MCG/ACT inhaler Inhale 2 puffs into the lungs every 6  (six) hours as needed for wheezing or shortness of breath.  . benzonatate (TESSALON) 200 MG capsule Take 1 capsule (200 mg total) by mouth 2 (two) times daily as needed for cough.  . Chlorphen-Phenyleph-ASA (ALKA-SELTZER PLUS COLD PO) Take by mouth as needed.     Allergies:   Shellfish allergy; Iodine; and Percocet [oxycodone-acetaminophen]    Social History   Tobacco Use  . Smoking status: Former Smoker    Years: 20.00    Types: Cigarettes  . Smokeless tobacco: Never Used  Substance Use Topics  . Alcohol use: Not on file    Comment: sometimes  . Drug use: Never     Family Hx: The patient's family history includes Diabetes in her brother and father.  ROS:   Please see the history of present illness.    Review of Systems  Constitutional: Negative for chills.  HENT: Positive for congestion. Negative for sore throat.   Respiratory: Positive for cough.   Musculoskeletal: Positive for neck pain.    All other systems reviewed and are negative.   Labs/Other Tests and Data Reviewed:    Recent Labs: 10/10/2018: ALT 16; BUN 7; Creatinine, Ser 0.81; Hemoglobin 14.0; Platelets 375; Potassium 4.3; Sodium 135   Recent Lipid Panel No results found for: CHOL, TRIG, HDL, CHOLHDL, LDLCALC, LDLDIRECT  Wt Readings from Last 3 Encounters:  10/10/18 120 lb 12.8 oz (54.8 kg)     Exam:    Vital  Signs:  LMP 12/12/2018 (Exact Date)     Physical Exam  Constitutional: No distress.  Psychiatric: Mood, memory, affect and judgment normal.    ASSESSMENT & PLAN:    1. Acute non-recurrent frontal sinusitis  Sinus pressure  Will treat with augmentin  - amoxicillin-clavulanate (AUGMENTIN) 875-125 MG tablet; Take 1 tablet by mouth 2 (two) times daily for 7 days.  Dispense: 14 tablet; Refill: 0  2. Cough  She had been treated earlier this year for pleurisy and the cough never really got better  Will treat with tessalon perles  augmentin will help if this is upper respiratory infection  - benzonatate (TESSALON) 200 MG capsule; Take 1 capsule (200 mg total) by mouth 2 (two) times daily as needed for cough.  Dispense: 20 capsule; Refill: 0   COVID-19 Education: The signs and symptoms of COVID-19 were discussed with the patient and how to seek care for testing (follow up with PCP or arrange E-visit).  The importance of social distancing was discussed today.  Patient Risk:   After full review of this patients clinical status, I feel that they are at least moderate risk at this time.  Time:   Today, I have spent 11 minutes/ seconds with the patient with telehealth technology discussing above diagnoses.     Medication Adjustments/Labs and Tests Ordered: Current medicines are reviewed at length with the patient today.  Concerns regarding medicines are outlined above.   Tests Ordered: No orders of the defined types were placed in this encounter.   Medication Changes: No orders of the defined types were placed in this encounter.   Disposition:  Follow up prn  Signed, Arnette Felts, FNP

## 2018-12-18 NOTE — Telephone Encounter (Signed)
PT CALLED EXPERIENCING ALLERGY SYMPTOMS, PT CONSENTED TO VIRTUAL VISIT TODAY 5/11

## 2018-12-19 NOTE — Telephone Encounter (Signed)
Please update the work note to indicate to return to work on 12/19/2018 thanks

## 2019-03-08 ENCOUNTER — Encounter (INDEPENDENT_AMBULATORY_CARE_PROVIDER_SITE_OTHER): Payer: Self-pay

## 2019-03-08 ENCOUNTER — Encounter: Payer: Self-pay | Admitting: Internal Medicine

## 2019-03-08 ENCOUNTER — Ambulatory Visit (INDEPENDENT_AMBULATORY_CARE_PROVIDER_SITE_OTHER): Payer: BC Managed Care – PPO | Admitting: Internal Medicine

## 2019-03-08 ENCOUNTER — Other Ambulatory Visit: Payer: Self-pay

## 2019-03-08 VITALS — BP 146/92 | Temp 98.2°F | Ht 59.5 in

## 2019-03-08 DIAGNOSIS — R202 Paresthesia of skin: Secondary | ICD-10-CM

## 2019-03-08 DIAGNOSIS — R030 Elevated blood-pressure reading, without diagnosis of hypertension: Secondary | ICD-10-CM

## 2019-03-08 DIAGNOSIS — R430 Anosmia: Secondary | ICD-10-CM

## 2019-03-08 DIAGNOSIS — R432 Parageusia: Secondary | ICD-10-CM

## 2019-03-08 DIAGNOSIS — R059 Cough, unspecified: Secondary | ICD-10-CM

## 2019-03-08 DIAGNOSIS — R05 Cough: Secondary | ICD-10-CM

## 2019-03-08 DIAGNOSIS — U071 COVID-19: Secondary | ICD-10-CM

## 2019-03-08 MED ORDER — AZITHROMYCIN 250 MG PO TABS: ORAL_TABLET | ORAL | 0 refills | Status: AC

## 2019-03-08 MED ORDER — ALBUTEROL SULFATE HFA 108 (90 BASE) MCG/ACT IN AERS: 2.0000 | INHALATION_SPRAY | Freq: Four times a day (QID) | RESPIRATORY_TRACT | 1 refills | Status: DC | PRN

## 2019-03-08 NOTE — Progress Notes (Signed)
Virtual Visit via Video   This visit type was conducted due to national recommendations for restrictions regarding the COVID-19 Pandemic (e.g. social distancing) in an effort to limit this patient's exposure and mitigate transmission in our community.  Due to her co-morbid illnesses, this patient is at least at moderate risk for complications without adequate follow up.  This format is felt to be most appropriate for this patient at this time.  All issues noted in this document were discussed and addressed.  A limited physical exam was performed with this format.    This visit type was conducted due to national recommendations for restrictions regarding the COVID-19 Pandemic (e.g. social distancing) in an effort to limit this patient's exposure and mitigate transmission in our community.  Patients identity confirmed using two different identifiers.  This format is felt to be most appropriate for this patient at this time.  All issues noted in this document were discussed and addressed.  No physical exam was performed (except for noted visual exam findings with Video Visits).    Date:  03/08/2019   ID:  [name], DOB Jan 29, 1976, MRN [id number]  Patient Location:  Home, accompanied by husband  Provider location:   Office    Chief Complaint:  "I have the coronavirus"  History of Present Illness:    [name] is a 43 y.o. female who presents via video conferencing for a telehealth visit today.    The patient does have symptoms concerning for COVID-19 infection (fever, chills, cough, or new shortness of breath).   She presents today for virtual visit. She prefers this method of contact due to COVID-19 pandemic.  She reports she was diagnosed with COVID-19 yesterday. She states she started to have chest pain last Thursday. Friday, she reports her nose was burning and she thought she was catching a cold. She then developed a cough on Saturday. She went to [facility] on Monday for  evaluation and she found out yesterday she was positive for COVID-19. She is also concerned b/c she had elevated bp on Monday 140s/90s. She denies headaches and visual disturbance. She is not sure what to expect. She has been trying to work from home, but states she gets tired after about 3-5 hours.     No past medical history on file. Past Surgical History:  Procedure Laterality Date  . CESAREAN SECTION  2001  . TENDON REPAIR  2010     Current Meds  Medication Sig  . Chlorphen-Phenyleph-ASA (ALKA-SELTZER PLUS COLD PO) Take by mouth as needed.  Marland Kitchen guaiFENesin (MUCINEX) 600 MG 12 hr tablet Take by mouth 2 (two) times daily.     Allergies:   Shellfish allergy, Iodine, and Percocet [oxycodone-acetaminophen]   Social History   Tobacco Use  . Smoking status: Former Smoker    Years: 20.00    Types: Cigarettes  . Smokeless tobacco: Never Used  Substance Use Topics  . Alcohol use: Not on file    Comment: sometimes  . Drug use: Never     Family Hx: The patient's family history includes Diabetes in her brother and father.  ROS:   Please see the history of present illness.    Review of Systems  Constitutional: Positive for malaise/fatigue.  Respiratory: Positive for cough.   Cardiovascular: Negative.   Gastrointestinal: Negative.   Neurological: Positive for tingling.  Psychiatric/Behavioral: Negative.     All other systems reviewed and are negative.   Labs/Other Tests and Data Reviewed:    Recent Labs:  10/10/2018: ALT 16; BUN 7; Creatinine, Ser 0.81; Hemoglobin 14.0; Platelets 375; Potassium 4.3; Sodium 135   Recent Lipid Panel No results found for: CHOL, TRIG, HDL, CHOLHDL, LDLCALC, LDLDIRECT  Wt Readings from Last 3 Encounters:  10/10/18 120 lb 12.8 oz (54.8 kg)     Exam:    Vital Signs:  BP (!) 146/92 Comment: pt provided  Temp 98.2 F (36.8 C) (Oral)   Ht 4' 11.5" (1.511 m)   LMP 03/03/2019   BMI 23.99 kg/m     Physical Exam  Constitutional: She is  oriented to person, place, and time and well-developed, well-nourished, and in no distress.  HENT:  Head: Normocephalic and atraumatic.  Pulmonary/Chest: Effort normal.  She is able to speak in full sentences.   Neurological: She is alert and oriented to person, place, and time.  Psychiatric: Affect normal.  Nursing note and vitals reviewed.   ASSESSMENT & PLAN:     1. COVID-19 virus infection  She was given rx Zpak and rx albuterol inhaler.  She is encouraged to take full abx course. She is advised to get re-tested next Wed. [facility] She is also advised to check temperature daily, she agrees to email me her temps daily. She promises to go to ER should she develop SOB.   2. Elevated blood pressure reading  This is new for her, likely due to acute illness. I plan to have her f/u in 3 months. She is advised to avoid adding salt to her foods and try to avoid processed foods which tend to be high in sodium.   3. Paresthesia  I am not sure if this is related to her acute COVID infection. She will let me know if her sx persist. If so, I plan to check thyroid and vit b12 levels.   4. Cough  - albuterol (VENTOLIN HFA) 108 (90 Base) MCG/ACT inhaler; Inhale 2 puffs into the lungs every 6 (six) hours as needed for wheezing or shortness of breath.  Dispense: 18 g; Refill: 1  5. Loss of taste  Pt advised this ma persist after infection has resolved.   6. Loss, sense of, smell  Please see as above.   COVID-19 Education: The signs and symptoms of COVID-19 were discussed with the patient and how to seek care for testing (follow up with PCP or arrange E-visit).  The importance of social distancing was discussed today.  Patient Risk:   After full review of this patients clinical status, I feel that they are at least moderate risk at this time.    Medication Adjustments/Labs and Tests Ordered: Current medicines are reviewed at length with the patient today.   Concerns regarding medicines are outlined above.   Tests Ordered: No orders of the defined types were placed in this encounter.   Medication Changes: No orders of the defined types were placed in this encounter.   Disposition:  Follow up prn  Signed, Gwynneth Alimentobyn N Chaela Branscum, MD

## 2019-03-08 NOTE — Patient Instructions (Signed)
This information is directly available on the CDC website: https://www.cdc.gov/coronavirus/2019-ncov/if-you-are-sick/steps-when-sick.html    Source:CDC Reference to specific commercial products, manufacturers, companies, or trademarks does not constitute its endorsement or recommendation by the U.S. Government, Department of Health and Human Services, or Centers for Disease Control and Prevention.  

## 2019-03-09 ENCOUNTER — Encounter (INDEPENDENT_AMBULATORY_CARE_PROVIDER_SITE_OTHER): Payer: Self-pay

## 2019-03-10 ENCOUNTER — Encounter (INDEPENDENT_AMBULATORY_CARE_PROVIDER_SITE_OTHER): Payer: Self-pay

## 2019-03-11 ENCOUNTER — Encounter (INDEPENDENT_AMBULATORY_CARE_PROVIDER_SITE_OTHER): Payer: Self-pay

## 2019-03-12 ENCOUNTER — Encounter (INDEPENDENT_AMBULATORY_CARE_PROVIDER_SITE_OTHER): Payer: Self-pay

## 2019-03-12 ENCOUNTER — Encounter: Payer: Self-pay | Admitting: Internal Medicine

## 2019-03-13 ENCOUNTER — Encounter (INDEPENDENT_AMBULATORY_CARE_PROVIDER_SITE_OTHER): Payer: Self-pay

## 2019-03-14 ENCOUNTER — Other Ambulatory Visit: Payer: Self-pay

## 2019-03-14 DIAGNOSIS — Z20822 Contact with and (suspected) exposure to covid-19: Secondary | ICD-10-CM

## 2019-03-15 ENCOUNTER — Telehealth: Payer: Self-pay | Admitting: *Deleted

## 2019-03-15 ENCOUNTER — Telehealth: Payer: Self-pay

## 2019-03-15 ENCOUNTER — Encounter: Payer: Self-pay | Admitting: Internal Medicine

## 2019-03-15 ENCOUNTER — Encounter (INDEPENDENT_AMBULATORY_CARE_PROVIDER_SITE_OTHER): Payer: Self-pay

## 2019-03-15 LAB — NOVEL CORONAVIRUS, NAA: SARS-CoV-2, NAA: NOT DETECTED

## 2019-03-15 NOTE — Telephone Encounter (Signed)
Attempted to contact pt due to [place] response new cough 03/15/2019; left message on voicemail with callback number [phone].

## 2019-03-15 NOTE — Telephone Encounter (Signed)
Pt states that her cough is dry, and it feels like her chest is rattaling; she also feels like there is congestion in her chest; the pt says that she just took a Mucinex DM, and she is about to take her albuterol inhaler; she said that she did use her inhaler on 03/14/2019 but she got a little relief before the 6 hours was up; she also says that she occasionally get winded with exertion; pt advised to contact her PCP for further recommendations; she verbalized understanding; will route to PCP for notification.

## 2019-03-15 NOTE — Telephone Encounter (Signed)
Pt notified pls contact pt. cshe called [facility] with new cough, if she experiences worsening sob, then she should go to ER. Use albuterol inhaler every 4-6 hours as needed.

## 2019-03-16 ENCOUNTER — Encounter (INDEPENDENT_AMBULATORY_CARE_PROVIDER_SITE_OTHER): Payer: Self-pay

## 2019-03-16 ENCOUNTER — Telehealth: Payer: Self-pay | Admitting: *Deleted

## 2019-03-16 NOTE — Telephone Encounter (Signed)
Attempted to contact pt per Dr Lynder Parents request; left message on voicemail for pt to contact Dr Baird Cancer so that she can be updated on how she is feeling.

## 2019-03-16 NOTE — Telephone Encounter (Signed)
Please check on her. How is she feeling today?

## 2019-03-18 ENCOUNTER — Encounter (INDEPENDENT_AMBULATORY_CARE_PROVIDER_SITE_OTHER): Payer: Self-pay

## 2019-03-18 ENCOUNTER — Encounter: Payer: Self-pay | Admitting: Internal Medicine

## 2019-03-19 ENCOUNTER — Other Ambulatory Visit: Payer: Self-pay

## 2019-03-19 ENCOUNTER — Encounter (INDEPENDENT_AMBULATORY_CARE_PROVIDER_SITE_OTHER): Payer: Self-pay

## 2019-03-20 ENCOUNTER — Other Ambulatory Visit: Payer: Self-pay

## 2019-03-20 ENCOUNTER — Encounter (INDEPENDENT_AMBULATORY_CARE_PROVIDER_SITE_OTHER): Payer: Self-pay

## 2019-03-20 ENCOUNTER — Telehealth: Payer: Self-pay

## 2019-03-20 MED ORDER — ALBUTEROL SULFATE HFA 108 (90 BASE) MCG/ACT IN AERS: 2.0000 | INHALATION_SPRAY | Freq: Four times a day (QID) | RESPIRATORY_TRACT | 1 refills | Status: DC | PRN

## 2019-03-20 NOTE — Telephone Encounter (Signed)
I left the pt a message that Dr. Baird Cancer wanted to know how the pt is doing and if the pt has had repeat testing.

## 2019-03-20 NOTE — Telephone Encounter (Signed)
The pt was asked how she is doing and the pt said that she is having chest pain off and on and that she went to the ER as suggested by Laurance Flatten, NP yesterday because the pt was having chest pain but she left because she had been waiting there for 10 hours.  The pt said that she is coughing and that she is using the inhaler and that the cough is causing her to have pain in her back.  The pt said yes she went back for the COVID test and it was negative.

## 2019-03-21 ENCOUNTER — Encounter (INDEPENDENT_AMBULATORY_CARE_PROVIDER_SITE_OTHER): Payer: Self-pay

## 2019-03-29 ENCOUNTER — Encounter: Payer: Self-pay | Admitting: Internal Medicine

## 2019-04-01 ENCOUNTER — Ambulatory Visit (HOSPITAL_COMMUNITY)
Admission: EM | Admit: 2019-04-01 | Discharge: 2019-04-01 | Disposition: A | Discharge status: Home / Self Care | Payer: BLUE CROSS/BLUE SHIELD | Attending: Family Medicine | Admitting: Family Medicine

## 2019-04-01 ENCOUNTER — Other Ambulatory Visit: Payer: Self-pay

## 2019-04-01 ENCOUNTER — Encounter (HOSPITAL_COMMUNITY): Payer: Self-pay

## 2019-04-01 DIAGNOSIS — J0101 Acute recurrent maxillary sinusitis: Secondary | ICD-10-CM | POA: Diagnosis not present

## 2019-04-01 DIAGNOSIS — R05 Cough: Secondary | ICD-10-CM | POA: Diagnosis not present

## 2019-04-01 DIAGNOSIS — R059 Cough, unspecified: Secondary | ICD-10-CM

## 2019-04-01 HISTORY — DX: COVID-19: U07.1

## 2019-04-01 MED ORDER — AMOXICILLIN 500 MG PO CAPS: 1000.0000 mg | ORAL_CAPSULE | Freq: Three times a day (TID) | ORAL | 0 refills | Status: AC

## 2019-04-01 MED ORDER — AZITHROMYCIN 250 MG PO TABS: 250.0000 mg | ORAL_TABLET | Freq: Every day | ORAL | 0 refills | Status: DC

## 2019-04-01 NOTE — ED Provider Notes (Signed)
[name]    CSN: [id number] Arrival date & time: 04/01/19  1035      History   Chief Complaint Chief Complaint  Patient presents with  . Facial Pain    HPI [name] is a 43 y.o. female with history of frontal sinusitis presenting for bilateral maxillary facial pain, ear pain, productive cough.  Patient states she has had upper respiratory symptoms since Thursday, and cough for the last 2 to 3 weeks.  Patient states it is largely productive, though not hemoptic.  Patient denies shortness of breath, wheezing.  Was given albuterol inhaler by her PCP which allows her to cough stuff up more.  Patient denies sick contacts: Had negative COVID test on 8/5 (positive COVID on 7/30).  Patient has not recently been antibiotics.  Of note, patient's blood pressure is elevated on initial presentation, though repeat was 135/88: Denies chest pain, lightheadedness, shortness of breath, abdominal pain.  Patient states she is tried allergy medications without significant relief.  Of note, patient does not like using intranasal sprays, so she has not tried it.   Wheezing LUL No rales Pos 7/30, neg 8/5  Past Medical History:  Diagnosis Date  . COVID-19     Patient Active Problem List   Diagnosis Date Noted  . Acute non-recurrent frontal sinusitis 12/18/2018  . Pleurisy 10/10/2018  . Lower respiratory infection 10/10/2018  . Cough 10/10/2018    Past Surgical History:  Procedure Laterality Date  . CESAREAN SECTION  2001  . TENDON REPAIR  2010    OB History   No obstetric history on file.      Home Medications    Prior to Admission medications   Medication Sig Start Date End Date Taking? Authorizing Provider  albuterol (VENTOLIN HFA) 108 (90 Base) MCG/ACT inhaler Inhale 2 puffs into the lungs every 6 (six) hours as needed for wheezing or shortness of breath. 03/08/19   Glendale Chard, MD  albuterol (VENTOLIN HFA) 108 (90 Base) MCG/ACT inhaler Inhale 2 puffs into the  lungs every 6 (six) hours as needed for wheezing or shortness of breath. 03/20/19   Minette Brine, FNP  amoxicillin (AMOXIL) 500 MG capsule Take 2 capsules (1,000 mg total) by mouth 3 (three) times daily for 7 days. 04/01/19 04/08/19  Hall-Potvin, [place], PA-C  azithromycin (ZITHROMAX) 250 MG tablet Take 1 tablet (250 mg total) by mouth daily. Take first 2 tablets together, then 1 every day until finished. 04/01/19   Hall-Potvin, [place], PA-C  Chlorphen-Phenyleph-ASA (ALKA-SELTZER PLUS COLD PO) Take by mouth as needed.    [provider]  guaiFENesin (MUCINEX) 600 MG 12 hr tablet Take by mouth 2 (two) times daily.    [provider]    Family History Family History  Problem Relation Age of Onset  . Diabetes Father   . Diabetes Brother     Social History Social History   Tobacco Use  . Smoking status: Former Smoker    Years: 20.00    Types: Cigarettes  . Smokeless tobacco: Never Used  Substance Use Topics  . Alcohol use: Not on file    Comment: sometimes  . Drug use: Never     Allergies   Shellfish allergy, Iodine, and Percocet [oxycodone-acetaminophen]   Review of Systems Review of Systems  Constitutional: Negative for activity change, appetite change, fatigue and fever.  HENT: Positive for ear pain, sinus pressure and sinus pain. Negative for congestion, ear discharge, hearing loss, postnasal drip, rhinorrhea, sneezing, sore throat, tinnitus, trouble  swallowing and voice change.   Eyes: Negative for pain, redness and visual disturbance.  Respiratory: Positive for cough. Negative for chest tightness, shortness of breath and wheezing.   Cardiovascular: Negative for chest pain and palpitations.  Gastrointestinal: Negative for abdominal pain, diarrhea, nausea and vomiting.  Musculoskeletal: Negative for arthralgias and myalgias.  Skin: Negative for rash and wound.  Neurological: Negative for syncope and headaches.     Physical Exam Triage Vital Signs ED  Triage Vitals  Enc Vitals Group     BP 04/01/19 1142 (!) 137/100     Pulse Rate 04/01/19 1142 97     Resp 04/01/19 1142 18     Temp 04/01/19 1142 98 F (36.7 C)     Temp Source 04/01/19 1142 Oral     SpO2 04/01/19 1142 100 %     Weight 04/01/19 1139 127 lb (57.6 kg)     Height --      Head Circumference --      Peak Flow --      Pain Score 04/01/19 1139 2     Pain Loc --      Pain Edu? --      Excl. in GC? --    No data found.  Updated Vital Signs BP (!) 137/100 (BP Location: Right Arm)   Pulse 97   Temp 98 F (36.7 C) (Oral)   Resp 18   Wt 127 lb (57.6 kg)   LMP 02/27/2019   SpO2 100%   BMI 25.22 kg/m   Visual Acuity Right Eye Distance:   Left Eye Distance:   Bilateral Distance:    Right Eye Near:   Left Eye Near:    Bilateral Near:     Physical Exam Constitutional:      General: She is not in acute distress. HENT:     Head: Normocephalic and atraumatic.     Jaw: There is normal jaw occlusion. No tenderness or pain on movement.     Right Ear: Hearing, ear canal and external ear normal. No tenderness. No mastoid tenderness.     Left Ear: Hearing, tympanic membrane, ear canal and external ear normal. No tenderness. No mastoid tenderness.     Ears:     Comments: Right TM erythematous without bulging, retraction, perforation    Nose: No nasal deformity, septal deviation or nasal tenderness.     Right Turbinates: Not swollen or pale.     Left Turbinates: Not swollen or pale.     Right Sinus: No maxillary sinus tenderness or frontal sinus tenderness.     Left Sinus: No maxillary sinus tenderness or frontal sinus tenderness.     Comments: Bilateral turbinate edema with erythematous mucosa.  Frontal and maxillary sinus tenderness bilaterally    Mouth/Throat:     Lips: Pink. No lesions.     Mouth: Mucous membranes are moist. No injury.     Pharynx: Oropharynx is clear. Uvula midline. No posterior oropharyngeal erythema or uvula swelling.     Comments: no tonsillar  exudate or hypertrophy Eyes:     General: No scleral icterus.    Conjunctiva/sclera: Conjunctivae normal.     Pupils: Pupils are equal, round, and reactive to light.  Neck:     Musculoskeletal: Normal range of motion and neck supple. No muscular tenderness.  Cardiovascular:     Rate and Rhythm: Normal rate and regular rhythm.  Pulmonary:     Effort: Pulmonary effort is normal. No respiratory distress.     Breath sounds: No  rales.     Comments: Left upper lobe expiratory wheezing Lymphadenopathy:     Cervical: No cervical adenopathy.  Skin:    General: Skin is warm.     Capillary Refill: Capillary refill takes less than 2 seconds.     Coloration: Skin is not jaundiced or pale.     Findings: No bruising.  Neurological:     General: No focal deficit present.     Mental Status: She is alert and oriented to person, place, and time.      UC Treatments / Results  Labs (all labs ordered are listed, but only abnormal results are displayed) Labs Reviewed - No data to display  EKG   Radiology No results found.  Procedures Procedures (including critical care time)  Medications Ordered in UC Medications - No data to display  Initial Impression / Assessment and Plan / UC Course  I have reviewed the triage vital signs and the nursing notes.  Pertinent labs & imaging results that were available during my care of the patient were reviewed by me and considered in my medical decision making (see chart for details).     Patient appears nontoxic, repeat blood pressure135/88.  Due to prolonged, productive cough and acute maxillary sinusitis: We will treat with amoxicillin, azithromycin as listed below to cover for presumptive CAP, acute bacterial sinusitis.  Return precautions discussed, patient verbalized understanding and is agreeable to plan. Final Clinical Impressions(s) / UC Diagnoses   Final diagnoses:  Cough  Acute recurrent maxillary sinusitis     Discharge Instructions      Take antibiotics as prescribed Return for worsening cough, fever, difficulty breathing. May continue inhaler as needed.    ED Prescriptions    Medication Sig Dispense Auth. Provider   amoxicillin (AMOXIL) 500 MG capsule Take 2 capsules (1,000 mg total) by mouth 3 (three) times daily for 7 days. 42 capsule Hall-Potvin, [place], PA-C   azithromycin (ZITHROMAX) 250 MG tablet Take 1 tablet (250 mg total) by mouth daily. Take first 2 tablets together, then 1 every day until finished. 6 tablet Hall-Potvin, [place], PA-C     Controlled Substance Prescriptions [place] Controlled Substance Registry consulted? Not Applicable   Shea EvansHall-Potvin, [place], [place]-C 04/01/19 1305

## 2019-04-01 NOTE — Discharge Instructions (Addendum)
Take antibiotics as prescribed Return for worsening cough, fever, difficulty breathing. May continue inhaler as needed.

## 2019-04-01 NOTE — ED Triage Notes (Signed)
Pt states she has sinus pressure and drainage. Pt she has been tested twice for Covid and she was positive once. Pt cc cough and congestion, sinus issues and  chills.

## 2019-05-01 ENCOUNTER — Other Ambulatory Visit: Payer: Self-pay

## 2019-05-01 ENCOUNTER — Encounter: Payer: Self-pay | Admitting: Internal Medicine

## 2019-05-01 ENCOUNTER — Ambulatory Visit (INDEPENDENT_AMBULATORY_CARE_PROVIDER_SITE_OTHER): Payer: BC Managed Care – PPO | Admitting: Internal Medicine

## 2019-05-01 VITALS — BP 118/64 | HR 80 | Temp 98.3°F | Ht 59.0 in | Wt 126.8 lb

## 2019-05-01 DIAGNOSIS — Z0000 Encounter for general adult medical examination without abnormal findings: Secondary | ICD-10-CM | POA: Diagnosis not present

## 2019-05-01 DIAGNOSIS — Z8619 Personal history of other infectious and parasitic diseases: Secondary | ICD-10-CM | POA: Diagnosis not present

## 2019-05-01 DIAGNOSIS — R062 Wheezing: Secondary | ICD-10-CM

## 2019-05-01 DIAGNOSIS — Z8616 Personal history of COVID-19: Secondary | ICD-10-CM

## 2019-05-01 LAB — POCT URINALYSIS DIPSTICK
Bilirubin, UA: NEGATIVE
Glucose, UA: NEGATIVE
Ketones, UA: NEGATIVE
Leukocytes, UA: NEGATIVE
Nitrite, UA: NEGATIVE
Protein, UA: NEGATIVE
Spec Grav, UA: 1.02 (ref 1.010–1.025)
Urobilinogen, UA: 0.2 E.U./dL
pH, UA: 6 (ref 5.0–8.0)

## 2019-05-01 NOTE — Patient Instructions (Signed)
Health Maintenance, Female Adopting a healthy lifestyle and getting preventive care are important in promoting health and wellness. Ask your health care provider about:  The right schedule for you to have regular tests and exams.  Things you can do on your own to prevent diseases and keep yourself healthy. What should I know about diet, weight, and exercise? Eat a healthy diet   Eat a diet that includes plenty of vegetables, fruits, low-fat dairy products, and lean protein.  Do not eat a lot of foods that are high in solid fats, added sugars, or sodium. Maintain a healthy weight Body mass index (BMI) is used to identify weight problems. It estimates body fat based on height and weight. Your health care provider can help determine your BMI and help you achieve or maintain a healthy weight. Get regular exercise Get regular exercise. This is one of the most important things you can do for your health. Most adults should:  Exercise for at least 150 minutes each week. The exercise should increase your heart rate and make you sweat (moderate-intensity exercise).  Do strengthening exercises at least twice a week. This is in addition to the moderate-intensity exercise.  Spend less time sitting. Even light physical activity can be beneficial. Watch cholesterol and blood lipids Have your blood tested for lipids and cholesterol at 45 years of age, then have this test every 5 years. Have your cholesterol levels checked more often if:  Your lipid or cholesterol levels are high.  You are older than 45 years of age.  You are at high risk for heart disease. What should I know about cancer screening? Depending on your health history and family history, you may need to have cancer screening at various ages. This may include screening for:  Breast cancer.  Cervical cancer.  Colorectal cancer.  Skin cancer.  Lung cancer. What should I know about heart disease, diabetes, and high blood  pressure? Blood pressure and heart disease  High blood pressure causes heart disease and increases the risk of stroke. This is more likely to develop in people who have high blood pressure readings, are of African descent, or are overweight.  Have your blood pressure checked: ? Every 3-5 years if you are 18-39 years of age. ? Every year if you are 40 years old or older. Diabetes Have regular diabetes screenings. This checks your fasting blood sugar level. Have the screening done:  Once every three years after age 40 if you are at a normal weight and have a low risk for diabetes.  More often and at a younger age if you are overweight or have a high risk for diabetes. What should I know about preventing infection? Hepatitis B If you have a higher risk for hepatitis B, you should be screened for this virus. Talk with your health care provider to find out if you are at risk for hepatitis B infection. Hepatitis C Testing is recommended for:  Everyone born from 1945 through 1965.  Anyone with known risk factors for hepatitis C. Sexually transmitted infections (STIs)  Get screened for STIs, including gonorrhea and chlamydia, if: ? You are sexually active and are younger than 45 years of age. ? You are older than 45 years of age and your health care provider tells you that you are at risk for this type of infection. ? Your sexual activity has changed since you were last screened, and you are at increased risk for chlamydia or gonorrhea. Ask your health care provider if   you are at risk.  Ask your health care provider about whether you are at high risk for HIV. Your health care provider may recommend a prescription medicine to help prevent HIV infection. If you choose to take medicine to prevent HIV, you should first get tested for HIV. You should then be tested every 3 months for as long as you are taking the medicine. Pregnancy  If you are about to stop having your period (premenopausal) and  you may become pregnant, seek counseling before you get pregnant.  Take 400 to 800 micrograms (mcg) of folic acid every day if you become pregnant.  Ask for birth control (contraception) if you want to prevent pregnancy. Osteoporosis and menopause Osteoporosis is a disease in which the bones lose minerals and strength with aging. This can result in bone fractures. If you are 65 years old or older, or if you are at risk for osteoporosis and fractures, ask your health care provider if you should:  Be screened for bone loss.  Take a calcium or vitamin D supplement to lower your risk of fractures.  Be given hormone replacement therapy (HRT) to treat symptoms of menopause. Follow these instructions at home: Lifestyle  Do not use any products that contain nicotine or tobacco, such as cigarettes, e-cigarettes, and chewing tobacco. If you need help quitting, ask your health care provider.  Do not use street drugs.  Do not share needles.  Ask your health care provider for help if you need support or information about quitting drugs. Alcohol use  Do not drink alcohol if: ? Your health care provider tells you not to drink. ? You are pregnant, may be pregnant, or are planning to become pregnant.  If you drink alcohol: ? Limit how much you use to 0-1 drink a day. ? Limit intake if you are breastfeeding.  Be aware of how much alcohol is in your drink. In the [place] one drink equals one 12 oz bottle of beer (355 mL), one 5 oz glass of wine (148 mL), or one 1 oz glass of hard liquor (44 mL). General instructions  Schedule regular health, dental, and eye exams.  Stay current with your vaccines.  Tell your health care provider if: ? You often feel depressed. ? You have ever been abused or do not feel safe at home. Summary  Adopting a healthy lifestyle and getting preventive care are important in promoting health and wellness.  Follow your health care provider's instructions about healthy  diet, exercising, and getting tested or screened for diseases.  Follow your health care provider's instructions on monitoring your cholesterol and blood pressure. This information is not intended to replace advice given to you by your health care provider. Make sure you discuss any questions you have with your health care provider. Document Released: 02/08/2011 Document Revised: 07/19/2018 Document Reviewed: 07/19/2018 Elsevier Patient Education  2020 Elsevier Inc.  

## 2019-05-02 LAB — CBC
Hematocrit: 40.3 % (ref 34.0–46.6)
Hemoglobin: 13.3 g/dL (ref 11.1–15.9)
MCH: 30.6 pg (ref 26.6–33.0)
MCHC: 33 g/dL (ref 31.5–35.7)
MCV: 93 fL (ref 79–97)
Platelets: 335 10*3/uL (ref 150–450)
RBC: 4.35 x10E6/uL (ref 3.77–5.28)
RDW: 11.3 % — ABNORMAL LOW (ref 11.7–15.4)
WBC: 7.1 10*3/uL (ref 3.4–10.8)

## 2019-05-02 LAB — LIPID PANEL
Chol/HDL Ratio: 2.9 ratio (ref 0.0–4.4)
Cholesterol, Total: 146 mg/dL (ref 100–199)
HDL: 51 mg/dL (ref >39)
LDL Chol Calc (NIH): 82 mg/dL (ref 0–99)
Triglycerides: 61 mg/dL (ref 0–149)
VLDL Cholesterol Cal: 13 mg/dL (ref 5–40)

## 2019-05-02 LAB — CMP14+EGFR
ALT: 16 IU/L (ref 0–32)
AST: 18 IU/L (ref 0–40)
Albumin/Globulin Ratio: 2 (ref 1.2–2.2)
Albumin: 4.5 g/dL (ref 3.8–4.8)
Alkaline Phosphatase: 109 IU/L (ref 39–117)
BUN/Creatinine Ratio: 13 (ref 9–23)
BUN: 10 mg/dL (ref 6–24)
Bilirubin Total: 0.3 mg/dL (ref 0.0–1.2)
CO2: 25 mmol/L (ref 20–29)
Calcium: 9.2 mg/dL (ref 8.7–10.2)
Chloride: 100 mmol/L (ref 96–106)
Creatinine, Ser: 0.79 mg/dL (ref 0.57–1.00)
GFR calc Af Amer: 105 mL/min/{1.73_m2} (ref >59)
GFR calc non Af Amer: 91 mL/min/{1.73_m2} (ref >59)
Globulin, Total: 2.2 g/dL (ref 1.5–4.5)
Glucose: 97 mg/dL (ref 65–99)
Potassium: 4 mmol/L (ref 3.5–5.2)
Sodium: 136 mmol/L (ref 134–144)
Total Protein: 6.7 g/dL (ref 6.0–8.5)

## 2019-05-02 LAB — HEMOGLOBIN A1C
Est. average glucose Bld gHb Est-mCnc: 114 mg/dL
Hgb A1c MFr Bld: 5.6 % (ref 4.8–5.6)

## 2019-05-02 LAB — TSH: TSH: 0.978 u[IU]/mL (ref 0.450–4.500)

## 2019-05-06 NOTE — Progress Notes (Signed)
Subjective:     Patient ID: [name] , female    DOB: 05-29-1976 , 43 y.o.   MRN: [id number]   Chief Complaint  Patient presents with  . Annual Exam    HPI  She is here today for a full physical exam. She does not wish to have a pap smear today. Her last pap was in past five years.     Past Medical History:  Diagnosis Date  . COVID-19      Family History  Problem Relation Age of Onset  . Diabetes Father   . Diabetes Brother      Current Outpatient Medications:  .  albuterol (VENTOLIN HFA) 108 (90 Base) MCG/ACT inhaler, Inhale 2 puffs into the lungs every 6 (six) hours as needed for wheezing or shortness of breath., Disp: 18 g, Rfl: 1 .  Chlorphen-Phenyleph-ASA (ALKA-SELTZER PLUS COLD PO), Take by mouth as needed., Disp: , Rfl:  .  guaiFENesin (MUCINEX) 600 MG 12 hr tablet, Take by mouth 2 (two) times daily., Disp: , Rfl:  .  albuterol (VENTOLIN HFA) 108 (90 Base) MCG/ACT inhaler, Inhale 2 puffs into the lungs every 6 (six) hours as needed for wheezing or shortness of breath. (Patient not taking: Reported on 05/01/2019), Disp: 18 g, Rfl: 1 .  azithromycin (ZITHROMAX) 250 MG tablet, Take 1 tablet (250 mg total) by mouth daily. Take first 2 tablets together, then 1 every day until finished. (Patient not taking: Reported on 05/01/2019), Disp: 6 tablet, Rfl: 0   Allergies  Allergen Reactions  . Shellfish Allergy Anaphylaxis, Hives and Itching  . Iodine Itching    hives  . Percocet [Oxycodone-Acetaminophen] Itching and Rash     Review of Systems  Constitutional: Negative.   HENT: Negative.   Eyes: Negative.   Respiratory: Positive for wheezing.        She had COVID earlier this summer. She is still with intermittent cough, wheezing. She still doesn't feel "quite right".   Cardiovascular: Negative.   Endocrine: Negative.   Genitourinary: Negative.   Musculoskeletal: Negative.   Skin: Negative.   Allergic/Immunologic: Negative.   Neurological: Negative.    Hematological: Negative.   Psychiatric/Behavioral: Negative.      Today's Vitals   05/01/19 1102  BP: 118/64  Pulse: 80  Temp: 98.3 F (36.8 C)  TempSrc: Oral  SpO2: 98%  Weight: 126 lb 12.8 oz (57.5 kg)  Height: _0  (1.499 m)   Body mass index is 25.61 kg/m.   Objective:  Physical Exam Vitals signs and nursing note reviewed.  Constitutional:      Appearance: Normal appearance.  HENT:     Head: Normocephalic and atraumatic.     Right Ear: Tympanic membrane, ear canal and external ear normal.     Left Ear: Tympanic membrane, ear canal and external ear normal.     Nose: Nose normal.     Mouth/Throat:     Mouth: Mucous membranes are moist.     Pharynx: Oropharynx is clear.  Eyes:     Extraocular Movements: Extraocular movements intact.     Conjunctiva/sclera: Conjunctivae normal.     Pupils: Pupils are equal, round, and reactive to light.  Neck:     Musculoskeletal: Normal range of motion and neck supple.  Cardiovascular:     Rate and Rhythm: Normal rate and regular rhythm.     Pulses: Normal pulses.     Heart sounds: Normal heart sounds.  Pulmonary:     Effort: Pulmonary effort is normal.  Breath sounds: Wheezing present.  Chest:     Breasts: Tanner Score is 5.        Right: Normal.        Left: Normal.  Abdominal:     General: Abdomen is flat. Bowel sounds are normal.     Palpations: Abdomen is soft.  Genitourinary:    Comments: deferred Musculoskeletal: Normal range of motion.  Skin:    General: Skin is warm and dry.  Neurological:     General: No focal deficit present.     Mental Status: She is alert and oriented to person, place, and time.  Psychiatric:        Mood and Affect: Mood normal.        Behavior: Behavior normal.         Assessment And Plan:     1. Physical exam  A full exam was performed.  Importance of monthly self breast exams wa discussed with the patient. PATIENT HAS BEEN ADVISED TO GET 30-45 MINUTES REGULAR EXERCISE NO  LESS THAN FOUR TO FIVE DAYS PER WEEK - BOTH WEIGHTBEARING EXERCISES AND AEROBIC ARE RECOMMENDED.  SHE WAS ADVISED TO FOLLOW A HEALTHY DIET WITH AT LEAST SIX FRUITS/VEGGIES PER DAY, DECREASE INTAKE OF RED MEAT, AND TO INCREASE FISH INTAKE TO TWO DAYS PER WEEK.  MEATS/FISH SHOULD NOT BE FRIED, BAKED OR BROILED IS PREFERABLE.  I SUGGEST WEARING SPF 50 SUNSCREEN ON EXPOSED PARTS AND ESPECIALLY WHEN IN THE DIRECT SUNLIGHT FOR AN EXTENDED PERIOD OF TIME.  PLEASE AVOID FAST FOOD RESTAURANTS AND INCREASE YOUR WATER INTAKE.  - CMP14+EGFR - CBC - Lipid panel - Hemoglobin A1c - POCT Urinalysis Dipstick (81002) - TSH  2. Wheezing  She is agreeable to Pulmonary evaluation.   - Ambulatory referral to Pulmonology  3. History of 2019 novel coronavirus disease (COVID-19)   [name], MD    THE PATIENT IS ENCOURAGED TO PRACTICE SOCIAL DISTANCING DUE TO THE COVID-19 PANDEMIC.

## 2020-02-15 ENCOUNTER — Encounter (HOSPITAL_COMMUNITY): Payer: Self-pay

## 2020-02-15 ENCOUNTER — Ambulatory Visit (HOSPITAL_COMMUNITY)
Admission: RE | Admit: 2020-02-15 | Discharge: 2020-02-15 | Disposition: A | Discharge status: Home / Self Care | Payer: BC Managed Care – PPO | Source: Ambulatory Visit | Attending: Family Medicine | Admitting: Family Medicine

## 2020-02-15 ENCOUNTER — Other Ambulatory Visit: Payer: Self-pay

## 2020-02-15 VITALS — BP 141/81 | HR 92 | Temp 98.8°F | Resp 16 | Ht 60.0 in | Wt 138.0 lb

## 2020-02-15 DIAGNOSIS — Z3202 Encounter for pregnancy test, result negative: Secondary | ICD-10-CM

## 2020-02-15 DIAGNOSIS — R350 Frequency of micturition: Secondary | ICD-10-CM | POA: Diagnosis not present

## 2020-02-15 DIAGNOSIS — Z202 Contact with and (suspected) exposure to infections with a predominantly sexual mode of transmission: Secondary | ICD-10-CM

## 2020-02-15 DIAGNOSIS — R1030 Lower abdominal pain, unspecified: Secondary | ICD-10-CM | POA: Insufficient documentation

## 2020-02-15 DIAGNOSIS — N898 Other specified noninflammatory disorders of vagina: Secondary | ICD-10-CM | POA: Diagnosis not present

## 2020-02-15 DIAGNOSIS — K644 Residual hemorrhoidal skin tags: Secondary | ICD-10-CM | POA: Diagnosis not present

## 2020-02-15 DIAGNOSIS — Z87891 Personal history of nicotine dependence: Secondary | ICD-10-CM | POA: Insufficient documentation

## 2020-02-15 LAB — POCT URINALYSIS DIP (DEVICE)
Bilirubin Urine: NEGATIVE
Glucose, UA: NEGATIVE mg/dL
Hgb urine dipstick: NEGATIVE
Ketones, ur: NEGATIVE mg/dL
Nitrite: NEGATIVE
Protein, ur: NEGATIVE mg/dL
Specific Gravity, Urine: 1.025 (ref 1.005–1.030)
Urobilinogen, UA: 1 mg/dL (ref 0.0–1.0)
pH: 6 (ref 5.0–8.0)

## 2020-02-15 LAB — HIV ANTIBODY (ROUTINE TESTING W REFLEX): HIV Screen 4th Generation wRfx: NONREACTIVE

## 2020-02-15 LAB — POC URINE PREG, ED: Preg Test, Ur: NEGATIVE

## 2020-02-15 LAB — RPR: RPR Ser Ql: NONREACTIVE

## 2020-02-15 MED ORDER — LIDOCAINE HCL (PF) 1 % IJ SOLN
INTRAMUSCULAR | Status: AC
  Filled 2020-02-15: qty 2

## 2020-02-15 MED ORDER — CEFTRIAXONE SODIUM 500 MG IJ SOLR
500.0000 mg | Freq: Once | INTRAMUSCULAR | Status: AC
  Administered 2020-02-15: 500 mg via INTRAMUSCULAR

## 2020-02-15 MED ORDER — DOXYCYCLINE HYCLATE 100 MG PO CAPS
100.0000 mg | ORAL_CAPSULE | Freq: Two times a day (BID) | ORAL | 0 refills | Status: AC
Start: 2020-02-15 — End: 2020-02-22

## 2020-02-15 MED ORDER — CEFTRIAXONE SODIUM 500 MG IJ SOLR
INTRAMUSCULAR | Status: AC
  Filled 2020-02-15: qty 500

## 2020-02-15 MED ORDER — HYDROCORTISONE ACETATE 1 % EX OINT: 1.0000 g | TOPICAL_OINTMENT | Freq: Two times a day (BID) | CUTANEOUS | 0 refills | Status: DC

## 2020-02-15 NOTE — ED Provider Notes (Signed)
[facility]    CSN: [id number] Arrival date & time: 02/15/20  5188      History   Chief Complaint Chief Complaint  Patient presents with  . STI exposure    HPI [name] is a 44 y.o. female presenting today for evaluation of abdominal pain and STD exposure.  Patient reports that she recently found out her partner was exposed to gonorrhea.  In the past couple days she has noticed some lower abdominal discomfort as well as a small amount of greenish discharge.  She reports some urinary frequency.  Denies itching or irritation.  Denies rash or lesions.  She has felt her hemorrhoids have flared up and her bleeding and slightly painful as well.  Denies nausea or vomiting.  Last menstrual cycle was the end of June.  Is not on birth control.  HPI  Past Medical History:  Diagnosis Date  . COVID-19     Patient Active Problem List   Diagnosis Date Noted  . Acute non-recurrent frontal sinusitis 12/18/2018  . Pleurisy 10/10/2018  . Lower respiratory infection 10/10/2018  . Cough 10/10/2018    Past Surgical History:  Procedure Laterality Date  . CESAREAN SECTION  2001  . TENDON REPAIR  2010    OB History   No obstetric history on file.      Home Medications    Prior to Admission medications   Medication Sig Start Date End Date Taking? Authorizing Provider  albuterol (VENTOLIN HFA) 108 (90 Base) MCG/ACT inhaler Inhale 2 puffs into the lungs every 6 (six) hours as needed for wheezing or shortness of breath. 03/20/19   Arnette Felts, FNP  doxycycline (VIBRAMYCIN) 100 MG capsule Take 1 capsule (100 mg total) by mouth 2 (two) times daily for 7 days. 02/15/20 02/22/20  Seleni Meller C, PA-C  Hydrocortisone Acetate 1 % OINT Apply 1 g topically in the morning and at bedtime. 02/15/20   Keishaun Hazel, Junius Creamer, PA-C    Family History Family History  Problem Relation Age of Onset  . Diabetes Father   . Diabetes Brother     Social History Social History   Tobacco Use  .  Smoking status: Former Smoker    Years: 20.00    Types: Cigarettes  . Smokeless tobacco: Never Used  Substance Use Topics  . Alcohol use: Yes    Comment: sometimes  . Drug use: Never     Allergies   Shellfish allergy, Iodine, and Percocet [oxycodone-acetaminophen]   Review of Systems Review of Systems  Constitutional: Negative for fever.  Respiratory: Negative for shortness of breath.   Cardiovascular: Negative for chest pain.  Gastrointestinal: Positive for abdominal pain. Negative for diarrhea, nausea and vomiting.  Genitourinary: Positive for frequency and vaginal discharge. Negative for dysuria, flank pain, genital sores, hematuria, menstrual problem, vaginal bleeding and vaginal pain.  Musculoskeletal: Negative for back pain.  Skin: Negative for rash.  Neurological: Negative for dizziness, light-headedness and headaches.     Physical Exam Triage Vital Signs ED Triage Vitals  Enc Vitals Group     BP 02/15/20 1001 (!) 141/81     Pulse Rate 02/15/20 1001 92     Resp 02/15/20 1001 16     Temp 02/15/20 1001 98.8 F (37.1 C)     Temp Source 02/15/20 1001 Oral     SpO2 02/15/20 1001 100 %     Weight 02/15/20 1002 138 lb (62.6 kg)     Height 02/15/20 1002 5' (1.524 m)  Head Circumference --      Peak Flow --      Pain Score 02/15/20 1002 2     Pain Loc --      Pain Edu? --      Excl. in GC? --    No data found.  Updated Vital Signs BP (!) 141/81   Pulse 92   Temp 98.8 F (37.1 C) (Oral)   Resp 16   Ht 5' (1.524 m)   Wt 138 lb (62.6 kg)   SpO2 100%   BMI 26.95 kg/m   Visual Acuity Right Eye Distance:   Left Eye Distance:   Bilateral Distance:    Right Eye Near:   Left Eye Near:    Bilateral Near:     Physical Exam Vitals and nursing note reviewed.  Constitutional:      Appearance: She is well-developed.     Comments: No acute distress  HENT:     Head: Normocephalic and atraumatic.     Nose: Nose normal.  Eyes:     Conjunctiva/sclera:  Conjunctivae normal.  Cardiovascular:     Rate and Rhythm: Normal rate.  Pulmonary:     Effort: Pulmonary effort is normal. No respiratory distress.  Abdominal:     General: There is no distension.     Comments: Soft, nondistended, nontender to light and deep palpation throughout abdomen  Genitourinary:    Comments: External female genitalia normal without rashes or lesions, has a mucosa pink, cervix mildly erythematous and friable; small amount of slightly greenish discharge noted Musculoskeletal:        General: Normal range of motion.     Cervical back: Neck supple.  Skin:    General: Skin is warm and dry.  Neurological:     Mental Status: She is alert and oriented to person, place, and time.      UC Treatments / Results  Labs (all labs ordered are listed, but only abnormal results are displayed) Labs Reviewed  POCT URINALYSIS DIP (DEVICE) - Abnormal; Notable for the following components:      Result Value   Leukocytes,Ua SMALL (*)    All other components within normal limits  RPR  HIV ANTIBODY (ROUTINE TESTING W REFLEX)  POC URINE PREG, ED  CERVICOVAGINAL ANCILLARY ONLY    EKG   Radiology No results found.  Procedures Procedures (including critical care time)  Medications Ordered in UC Medications  cefTRIAXone (ROCEPHIN) injection 500 mg (500 mg Intramuscular Given 02/15/20 1028)    Initial Impression / Assessment and Plan / UC Course  I have reviewed the triage vital signs and the nursing notes.  Pertinent labs & imaging results that were available during my care of the patient were reviewed by me and considered in my medical decision making (see chart for details).     STD screening pending, empirically treating for gonorrhea today with Rocephin and doxycycline x1 week.  Monitor for improvement of discharge and abdominal pain.   Discussed recommendations for hemorrhoids.  Discussed strict return precautions. Patient verbalized understanding and is  agreeable with plan.  Final Clinical Impressions(s) / UC Diagnoses   Final diagnoses:  Lower abdominal pain  Vaginal discharge  STD exposure     Discharge Instructions     We have treated you today for gonorrhea, with rocephin. Continue doxycycline twice daily x 1 week to cover chlamydia. Please refrain from sexual intercourse for 7 days while medicines eliminating infection.   We are testing you for HIV, Syphillis, Gonorrhea, Chlamydia,  Trichomonas, Yeast and Bacterial Vaginosis. We will call you if anything is positive and let you know if you require any further treatment. Please inform partners of any positive results.   Please return if symptoms not improving with treatment, development of fever, nausea, vomiting, abdominal pain.     ED Prescriptions    Medication Sig Dispense Auth. Provider   doxycycline (VIBRAMYCIN) 100 MG capsule Take 1 capsule (100 mg total) by mouth 2 (two) times daily for 7 days. 14 capsule Latasia Silberstein C, PA-C   Hydrocortisone Acetate 1 % OINT Apply 1 g topically in the morning and at bedtime. 28.4 g India Jolin, [place] C, PA-C     PDMP not reviewed this encounter.   Lew Dawes, [place] 02/15/20 [phone]

## 2020-02-15 NOTE — Discharge Instructions (Signed)
We have treated you today for gonorrhea, with rocephin. Continue doxycycline twice daily x 1 week to cover chlamydia. Please refrain from sexual intercourse for 7 days while medicines eliminating infection.   We are testing you for HIV, Syphillis, Gonorrhea, Chlamydia, Trichomonas, Yeast and Bacterial Vaginosis. We will call you if anything is positive and let you know if you require any further treatment. Please inform partners of any positive results.   Please return if symptoms not improving with treatment, development of fever, nausea, vomiting, abdominal pain.

## 2020-02-15 NOTE — ED Triage Notes (Signed)
Pt c/o 2/10 sharp pain in LLQ, RLQ of abdomen. PT states was exposed to gonorrhea. Pt states she would like tested for all of the STIs.

## 2020-02-18 ENCOUNTER — Telehealth (HOSPITAL_COMMUNITY): Payer: Self-pay

## 2020-02-18 LAB — CERVICOVAGINAL ANCILLARY ONLY
Bacterial Vaginitis (gardnerella): POSITIVE — AB
Candida Glabrata: NEGATIVE
Candida Vaginitis: NEGATIVE
Chlamydia: NEGATIVE
Comment: NEGATIVE
Comment: NEGATIVE
Comment: NEGATIVE
Comment: NEGATIVE
Comment: NEGATIVE
Comment: NORMAL
Neisseria Gonorrhea: POSITIVE — AB
Trichomonas: POSITIVE — AB

## 2020-02-18 MED ORDER — METRONIDAZOLE 500 MG PO TABS
500.0000 mg | ORAL_TABLET | Freq: Two times a day (BID) | ORAL | 0 refills | Status: DC
Start: 2020-02-18 — End: 2020-05-07

## 2020-05-07 ENCOUNTER — Other Ambulatory Visit (HOSPITAL_COMMUNITY)
Admission: RE | Admit: 2020-05-07 | Discharge: 2020-05-07 | Disposition: A | Discharge status: Home / Self Care | Payer: BC Managed Care – PPO | Source: Ambulatory Visit | Attending: Internal Medicine | Admitting: Internal Medicine

## 2020-05-07 ENCOUNTER — Ambulatory Visit (INDEPENDENT_AMBULATORY_CARE_PROVIDER_SITE_OTHER): Payer: BC Managed Care – PPO | Admitting: Internal Medicine

## 2020-05-07 ENCOUNTER — Encounter: Payer: Self-pay | Admitting: Internal Medicine

## 2020-05-07 ENCOUNTER — Other Ambulatory Visit: Payer: Self-pay

## 2020-05-07 VITALS — BP 126/66 | HR 81 | Temp 98.1°F | Ht 59.8 in | Wt 139.8 lb

## 2020-05-07 DIAGNOSIS — Z113 Encounter for screening for infections with a predominantly sexual mode of transmission: Secondary | ICD-10-CM

## 2020-05-07 DIAGNOSIS — Z23 Encounter for immunization: Secondary | ICD-10-CM

## 2020-05-07 DIAGNOSIS — Z01419 Encounter for gynecological examination (general) (routine) without abnormal findings: Secondary | ICD-10-CM

## 2020-05-07 DIAGNOSIS — Z6827 Body mass index (BMI) 27.0-27.9, adult: Secondary | ICD-10-CM

## 2020-05-07 DIAGNOSIS — M25562 Pain in left knee: Secondary | ICD-10-CM

## 2020-05-07 DIAGNOSIS — Z1211 Encounter for screening for malignant neoplasm of colon: Secondary | ICD-10-CM

## 2020-05-07 DIAGNOSIS — Z0000 Encounter for general adult medical examination without abnormal findings: Secondary | ICD-10-CM | POA: Diagnosis not present

## 2020-05-07 DIAGNOSIS — E559 Vitamin D deficiency, unspecified: Secondary | ICD-10-CM

## 2020-05-07 DIAGNOSIS — M25561 Pain in right knee: Secondary | ICD-10-CM | POA: Diagnosis not present

## 2020-05-07 DIAGNOSIS — R635 Abnormal weight gain: Secondary | ICD-10-CM | POA: Diagnosis not present

## 2020-05-07 DIAGNOSIS — G8929 Other chronic pain: Secondary | ICD-10-CM

## 2020-05-07 DIAGNOSIS — E663 Overweight: Secondary | ICD-10-CM

## 2020-05-07 LAB — POC HEMOCCULT BLD/STL (OFFICE/1-CARD/DIAGNOSTIC)
Card #1 Date: 9292021
Fecal Occult Blood, POC: NEGATIVE

## 2020-05-07 NOTE — Progress Notes (Signed)
I,[name],acting as a Education administrator for Paula Greenland, MD.,have documented all relevant documentation on the behalf of Paula Greenland, MD,as directed by  Paula Greenland, MD while in the presence of Paula Greenland, MD.  This visit occurred during the SARS-CoV-2 public health emergency.  Safety protocols were in place, including screening questions prior to the visit, additional usage of staff PPE, and extensive cleaning of exam room while observing appropriate contact time as indicated for disinfecting solutions.  Subjective:     Patient ID: [name] , female    DOB: March 14, 1976 , 44 y.o.   MRN: [id number]   Chief Complaint  Patient presents with  . Annual Exam    HPI  She is here today for a full physical exam. She does wish to have a pap smear today. She would also like to be tested for STDs. She has no other concerns at this time. She does report that she will start traveling again for work soon.     Past Medical History:  Diagnosis Date  . COVID-19      Family History  Problem Relation Age of Onset  . Diabetes Father   . Diabetes Brother      Current Outpatient Medications:  .  albuterol (VENTOLIN HFA) 108 (90 Base) MCG/ACT inhaler, Inhale 2 puffs into the lungs every 6 (six) hours as needed for wheezing or shortness of breath., Disp: 18 g, Rfl: 1   Allergies  Allergen Reactions  . Shellfish Allergy Anaphylaxis, Hives and Itching  . Iodine Itching    hives  . Percocet [Oxycodone-Acetaminophen] Itching and Rash      The patient states she uses none for birth control. Last LMP was Patient's last menstrual period was 04/09/2020.. Negative for Dysmenorrhea. Negative for: breast discharge, breast lump(s), breast pain and breast self exam. Associated symptoms include abnormal vaginal bleeding. Pertinent negatives include abnormal bleeding (hematology), anxiety, decreased libido, depression, difficulty falling sleep, dyspareunia, history of infertility, nocturia, sexual  dysfunction, sleep disturbances, urinary incontinence, urinary urgency, vaginal discharge and vaginal itching. Diet regular.The patient states her exercise level is  intermittent.  . The patient's tobacco use is:  Social History   Tobacco Use  Smoking Status Former Smoker  . Years: 20.00  . Types: Cigarettes  Smokeless Tobacco Never Used  . She has been exposed to passive smoke. The patient's alcohol use is:  Social History   Substance and Sexual Activity  Alcohol Use Yes   Comment: sometimes    Review of Systems  Constitutional: Negative.   HENT: Negative.   Eyes: Negative.   Respiratory: Negative.   Cardiovascular: Negative.   Gastrointestinal: Negative.   Endocrine: Negative.   Genitourinary: Negative.   Musculoskeletal: Positive for arthralgias.       She c/o b/l knee pain. There is pain with ambulation. Denies fall/trauma. Has been seen by Ortho in the past.   Skin: Negative.   Allergic/Immunologic: Negative.   Neurological: Negative.   Hematological: Negative.   Psychiatric/Behavioral: Negative.      Today's Vitals   05/07/20 1041  BP: 126/66  Pulse: 81  Temp: 98.1 F (36.7 C)  TempSrc: Oral  Weight: 139 lb 12.8 oz (63.4 kg)  Height: 4' 11.8" (1.519 m)   Body mass index is 27.49 kg/m.   Objective:  Physical Exam Vitals and nursing note reviewed. Exam conducted with a chaperone present.  Constitutional:      Appearance: Normal appearance.  HENT:     Head: Normocephalic and atraumatic.  Right Ear: Tympanic membrane, ear canal and external ear normal.     Left Ear: Tympanic membrane, ear canal and external ear normal.     Nose:     Comments: Deferred, masked    Mouth/Throat:     Comments: Deferred, masked Eyes:     Extraocular Movements: Extraocular movements intact.     Conjunctiva/sclera: Conjunctivae normal.     Pupils: Pupils are equal, round, and reactive to light.  Cardiovascular:     Rate and Rhythm: Normal rate and regular rhythm.      Pulses: Normal pulses.     Heart sounds: Normal heart sounds.  Pulmonary:     Effort: Pulmonary effort is normal.     Breath sounds: Normal breath sounds.  Chest:     Breasts: Tanner Score is 5.        Right: Normal.        Left: Normal.  Abdominal:     General: Abdomen is flat. Bowel sounds are normal.     Palpations: Abdomen is soft.  Genitourinary:    General: Normal vulva.     Exam position: Lithotomy position.     Labia:        Right: No rash or tenderness.        Left: No rash or tenderness.      Vagina: Normal.     Cervix: Normal.     Uterus: Normal.      Adnexa: Right adnexa normal and left adnexa normal.     Rectum: Normal. Guaiac result negative. No mass.     Comments: deferred Musculoskeletal:        General: Normal range of motion.     Cervical back: Normal range of motion and neck supple.     Comments: Crepitus b/l knees  Skin:    General: Skin is warm and dry.  Neurological:     General: No focal deficit present.     Mental Status: She is alert and oriented to person, place, and time.  Psychiatric:        Mood and Affect: Mood normal.        Behavior: Behavior normal.         Assessment And Plan:     1. Physical exam Comments: A full exam was performed. Importance of monthly self breast exams was discussed with the patient. I will refer her to GI for CRC screening. PATIENT IS ADVISED TO GET 30-45 MINUTES REGULAR EXERCISE NO LESS THAN FOUR TO FIVE DAYS PER WEEK - BOTH WEIGHTBEARING EXERCISES AND AEROBIC ARE RECOMMENDED.  PATIENT IS ADVISED TO FOLLOW A HEALTHY DIET WITH AT LEAST SIX FRUITS/VEGGIES PER DAY, DECREASE INTAKE OF RED MEAT, AND TO INCREASE FISH INTAKE TO TWO DAYS PER WEEK.  MEATS/FISH SHOULD NOT BE FRIED, BAKED OR BROILED IS PREFERABLE.  I SUGGEST WEARING SPF 50 SUNSCREEN ON EXPOSED PARTS AND ESPECIALLY WHEN IN THE DIRECT SUNLIGHT FOR AN EXTENDED PERIOD OF TIME.  PLEASE AVOID FAST FOOD RESTAURANTS AND INCREASE YOUR WATER INTAKE.  - CBC - Hemoglobin  A1c - CMP14+EGFR - Lipid panel - Hepatitis C antibody  2. Encounter for gynecological examination without abnormal finding Comments: Pap smear performed. Stool is heme negative.  - Cytology -Pap Smear - POC Hemoccult Bld/Stl (1-Cd Office Dx)  3. Chronic pain of both knees Comments: I will refer her to Ortho for further evaluation, including radiographic studies. Also advised to apply OTC Voltaren gel to apply to affected area two to three times daily prn. She agrees  to Ortho referral for further evaluation and radiographic studies.  - Ambulatory referral to Sports Medicine  4. Weight gain Comments: Encouraged to incorporate more exercise into her daily routine.   5. Vitamin D deficiency Comments: I will check vitamin D level and supplement as needed.  - VITAMIN D 25 Hydroxy (Vit-D Deficiency, Fractures)  6. Screening for STDs (sexually transmitted diseases) Comments: I will check STD screen.  - HIV antibody (with reflex) - RPR - HSV(herpes simplex vrs) 1+2 ab-IgG - Hepatitis B Surface Antigen  7. Need for influenza vaccination - Flu Vaccine QUAD 36+ mos IM  8. Overweight with body mass index (BMI) of 27 to 27.9 in adult Comments: She has gained 13 pounds since Sept 2020. Encouraged to strive for BMI less than 25 to decrease cardiac risk. Advised to increase daily activity as tolerated.  9. Special screening for malignant neoplasm of colon Comments: I wil refer her to GI for CRC screening.  - Ambulatory referral to Gastroenterology      Patient was given opportunity to ask questions. Patient verbalized understanding of the plan and was able to repeat key elements of the plan. All questions were answered to their satisfaction.   Paula Greenland, MD   I, Paula Greenland, MD, have reviewed all documentation for this visit. The documentation on 05/07/20 for the exam, diagnosis, procedures, and orders are all accurate and complete.  THE PATIENT IS ENCOURAGED TO PRACTICE  SOCIAL DISTANCING DUE TO THE COVID-19 PANDEMIC.

## 2020-05-07 NOTE — Patient Instructions (Signed)
Health Maintenance, Female Adopting a healthy lifestyle and getting preventive care are important in promoting health and wellness. Ask your health care provider about:  The right schedule for you to have regular tests and exams.  Things you can do on your own to prevent diseases and keep yourself healthy. What should I know about diet, weight, and exercise? Eat a healthy diet   Eat a diet that includes plenty of vegetables, fruits, low-fat dairy products, and lean protein.  Do not eat a lot of foods that are high in solid fats, added sugars, or sodium. Maintain a healthy weight Body mass index (BMI) is used to identify weight problems. It estimates body fat based on height and weight. Your health care provider can help determine your BMI and help you achieve or maintain a healthy weight. Get regular exercise Get regular exercise. This is one of the most important things you can do for your health. Most adults should:  Exercise for at least 150 minutes each week. The exercise should increase your heart rate and make you sweat (moderate-intensity exercise).  Do strengthening exercises at least twice a week. This is in addition to the moderate-intensity exercise.  Spend less time sitting. Even light physical activity can be beneficial. Watch cholesterol and blood lipids Have your blood tested for lipids and cholesterol at 46 years of age, then have this test every 5 years. Have your cholesterol levels checked more often if:  Your lipid or cholesterol levels are high.  You are older than 46 years of age.  You are at high risk for heart disease. What should I know about cancer screening? Depending on your health history and family history, you may need to have cancer screening at various ages. This may include screening for:  Breast cancer.  Cervical cancer.  Colorectal cancer.  Skin cancer.  Lung cancer. What should I know about heart disease, diabetes, and high blood  pressure? Blood pressure and heart disease  High blood pressure causes heart disease and increases the risk of stroke. This is more likely to develop in people who have high blood pressure readings, are of African descent, or are overweight.  Have your blood pressure checked: ? Every 3-5 years if you are 18-39 years of age. ? Every year if you are 40 years old or older. Diabetes Have regular diabetes screenings. This checks your fasting blood sugar level. Have the screening done:  Once every three years after age 40 if you are at a normal weight and have a low risk for diabetes.  More often and at a younger age if you are overweight or have a high risk for diabetes. What should I know about preventing infection? Hepatitis B If you have a higher risk for hepatitis B, you should be screened for this virus. Talk with your health care provider to find out if you are at risk for hepatitis B infection. Hepatitis C Testing is recommended for:  Everyone born from 1945 through 1965.  Anyone with known risk factors for hepatitis C. Sexually transmitted infections (STIs)  Get screened for STIs, including gonorrhea and chlamydia, if: ? You are sexually active and are younger than 46 years of age. ? You are older than 46 years of age and your health care provider tells you that you are at risk for this type of infection. ? Your sexual activity has changed since you were last screened, and you are at increased risk for chlamydia or gonorrhea. Ask your health care provider if   you are at risk.  Ask your health care provider about whether you are at high risk for HIV. Your health care provider may recommend a prescription medicine to help prevent HIV infection. If you choose to take medicine to prevent HIV, you should first get tested for HIV. You should then be tested every 3 months for as long as you are taking the medicine. Pregnancy  If you are about to stop having your period (premenopausal) and  you may become pregnant, seek counseling before you get pregnant.  Take 400 to 800 micrograms (mcg) of folic acid every day if you become pregnant.  Ask for birth control (contraception) if you want to prevent pregnancy. Osteoporosis and menopause Osteoporosis is a disease in which the bones lose minerals and strength with aging. This can result in bone fractures. If you are 65 years old or older, or if you are at risk for osteoporosis and fractures, ask your health care provider if you should:  Be screened for bone loss.  Take a calcium or vitamin D supplement to lower your risk of fractures.  Be given hormone replacement therapy (HRT) to treat symptoms of menopause. Follow these instructions at home: Lifestyle  Do not use any products that contain nicotine or tobacco, such as cigarettes, e-cigarettes, and chewing tobacco. If you need help quitting, ask your health care provider.  Do not use street drugs.  Do not share needles.  Ask your health care provider for help if you need support or information about quitting drugs. Alcohol use  Do not drink alcohol if: ? Your health care provider tells you not to drink. ? You are pregnant, may be pregnant, or are planning to become pregnant.  If you drink alcohol: ? Limit how much you use to 0-1 drink a day. ? Limit intake if you are breastfeeding.  Be aware of how much alcohol is in your drink. In the [place] one drink equals one 12 oz bottle of beer (355 mL), one 5 oz glass of wine (148 mL), or one 1 oz glass of hard liquor (44 mL). General instructions  Schedule regular health, dental, and eye exams.  Stay current with your vaccines.  Tell your health care provider if: ? You often feel depressed. ? You have ever been abused or do not feel safe at home. Summary  Adopting a healthy lifestyle and getting preventive care are important in promoting health and wellness.  Follow your health care provider's instructions about healthy  diet, exercising, and getting tested or screened for diseases.  Follow your health care provider's instructions on monitoring your cholesterol and blood pressure. This information is not intended to replace advice given to you by your health care provider. Make sure you discuss any questions you have with your health care provider. Document Revised: 07/19/2018 Document Reviewed: 07/19/2018 Elsevier Patient Education  2020 [employer]  

## 2020-05-08 LAB — LIPID PANEL
Chol/HDL Ratio: 3.4 ratio (ref 0.0–4.4)
Cholesterol, Total: 169 mg/dL (ref 100–199)
HDL: 50 mg/dL (ref >39)
LDL Chol Calc (NIH): 105 mg/dL — ABNORMAL HIGH (ref 0–99)
Triglycerides: 71 mg/dL (ref 0–149)
VLDL Cholesterol Cal: 14 mg/dL (ref 5–40)

## 2020-05-08 LAB — HEMOGLOBIN A1C
Est. average glucose Bld gHb Est-mCnc: 131 mg/dL
Hgb A1c MFr Bld: 6.2 % — ABNORMAL HIGH (ref 4.8–5.6)

## 2020-05-08 LAB — CMP14+EGFR
ALT: 14 IU/L (ref 0–32)
AST: 14 IU/L (ref 0–40)
Albumin/Globulin Ratio: 1.9 (ref 1.2–2.2)
Albumin: 4.4 g/dL (ref 3.8–4.8)
Alkaline Phosphatase: 110 IU/L (ref 44–121)
BUN/Creatinine Ratio: 12 (ref 9–23)
BUN: 9 mg/dL (ref 6–24)
Bilirubin Total: 0.3 mg/dL (ref 0.0–1.2)
CO2: 22 mmol/L (ref 20–29)
Calcium: 9.2 mg/dL (ref 8.7–10.2)
Chloride: 102 mmol/L (ref 96–106)
Creatinine, Ser: 0.77 mg/dL (ref 0.57–1.00)
GFR calc Af Amer: 107 mL/min/{1.73_m2} (ref >59)
GFR calc non Af Amer: 93 mL/min/{1.73_m2} (ref >59)
Globulin, Total: 2.3 g/dL (ref 1.5–4.5)
Glucose: 107 mg/dL — ABNORMAL HIGH (ref 65–99)
Potassium: 3.8 mmol/L (ref 3.5–5.2)
Sodium: 138 mmol/L (ref 134–144)
Total Protein: 6.7 g/dL (ref 6.0–8.5)

## 2020-05-08 LAB — CBC
Hematocrit: 38.3 % (ref 34.0–46.6)
Hemoglobin: 12.7 g/dL (ref 11.1–15.9)
MCH: 31.7 pg (ref 26.6–33.0)
MCHC: 33.2 g/dL (ref 31.5–35.7)
MCV: 96 fL (ref 79–97)
Platelets: 363 10*3/uL (ref 150–450)
RBC: 4.01 x10E6/uL (ref 3.77–5.28)
RDW: 11.9 % (ref 11.7–15.4)
WBC: 7.5 10*3/uL (ref 3.4–10.8)

## 2020-05-08 LAB — HEPATITIS B SURFACE ANTIGEN: Hepatitis B Surface Ag: NEGATIVE

## 2020-05-08 LAB — HIV ANTIBODY (ROUTINE TESTING W REFLEX): HIV Screen 4th Generation wRfx: NONREACTIVE

## 2020-05-08 LAB — HSV(HERPES SIMPLEX VRS) I + II AB-IGG
HSV 1 Glycoprotein G Ab, IgG: 5.85 index — ABNORMAL HIGH (ref 0.00–0.90)
HSV 2 IgG, Type Spec: 9.61 index — ABNORMAL HIGH (ref 0.00–0.90)

## 2020-05-08 LAB — VITAMIN D 25 HYDROXY (VIT D DEFICIENCY, FRACTURES): Vit D, 25-Hydroxy: 23.6 ng/mL — ABNORMAL LOW (ref 30.0–100.0)

## 2020-05-08 LAB — HEPATITIS C ANTIBODY: Hep C Virus Ab: <0.1 s/co ratio (ref 0.0–0.9)

## 2020-05-08 LAB — RPR: RPR Ser Ql: NONREACTIVE

## 2020-05-09 LAB — CYTOLOGY - PAP
Chlamydia: NEGATIVE
Comment: NEGATIVE
Comment: NEGATIVE
Comment: NEGATIVE
Comment: NORMAL
Diagnosis: NEGATIVE
High risk HPV: NEGATIVE
Neisseria Gonorrhea: NEGATIVE
Trichomonas: NEGATIVE

## 2020-05-27 LAB — TSH
TSH: 1.49 (ref 0.41–5.90)
TSH: 1.49 (ref 0.41–5.90)

## 2020-05-28 ENCOUNTER — Encounter: Payer: Self-pay | Admitting: Internal Medicine

## 2020-05-28 ENCOUNTER — Encounter: Payer: Self-pay | Admitting: Nurse Practitioner

## 2020-07-25 LAB — HM COLONOSCOPY

## 2020-09-25 ENCOUNTER — Encounter: Payer: Self-pay | Admitting: Internal Medicine

## 2021-05-13 ENCOUNTER — Encounter: Payer: Self-pay | Admitting: Internal Medicine

## 2021-05-13 ENCOUNTER — Other Ambulatory Visit: Payer: Self-pay

## 2021-05-13 ENCOUNTER — Ambulatory Visit (INDEPENDENT_AMBULATORY_CARE_PROVIDER_SITE_OTHER): Payer: BC Managed Care – PPO | Admitting: Internal Medicine

## 2021-05-13 VITALS — BP 118/80 | HR 82 | Temp 98.1°F | Ht 60.0 in | Wt 127.4 lb

## 2021-05-13 DIAGNOSIS — Z0000 Encounter for general adult medical examination without abnormal findings: Secondary | ICD-10-CM | POA: Diagnosis not present

## 2021-05-13 DIAGNOSIS — N951 Menopausal and female climacteric states: Secondary | ICD-10-CM | POA: Diagnosis not present

## 2021-05-13 DIAGNOSIS — Z23 Encounter for immunization: Secondary | ICD-10-CM

## 2021-05-13 DIAGNOSIS — F5101 Primary insomnia: Secondary | ICD-10-CM

## 2021-05-13 DIAGNOSIS — E559 Vitamin D deficiency, unspecified: Secondary | ICD-10-CM

## 2021-05-13 DIAGNOSIS — R7309 Other abnormal glucose: Secondary | ICD-10-CM | POA: Diagnosis not present

## 2021-05-13 NOTE — Progress Notes (Signed)
Rich Brave Llittleton,acting as a Education administrator for [name], MD.,have documented all relevant documentation on the behalf of [name], MD,as directed by  [name], MD while in the presence of [name], MD.  This visit occurred during the SARS-CoV-2 public health emergency.  Safety protocols were in place, including screening questions prior to the visit, additional usage of staff PPE, and extensive cleaning of exam room while observing appropriate contact time as indicated for disinfecting solutions.  Subjective:     Patient ID: [name] , female    DOB: 1975-08-29 , 45 y.o.   MRN: [id number]   Chief Complaint  Patient presents with   Annual Exam    HPI  Patient presents today for her HM. Her last pap smear was performed Sept 2021 at [facility].  She has no new concerns today.     Past Medical History:  Diagnosis Date   COVID-19      Family History  Problem Relation Age of Onset   Diabetes Father    Diabetes Brother      Current Outpatient Medications:    albuterol (VENTOLIN HFA) 108 (90 Base) MCG/ACT inhaler, Inhale 2 puffs into the lungs every 6 (six) hours as needed for wheezing or shortness of breath. (Patient not taking: Reported on 05/13/2021), Disp: 18 g, Rfl: 1   Allergies  Allergen Reactions   Shellfish Allergy Anaphylaxis, Hives and Itching   Iodine Itching    hives   Percocet [Oxycodone-Acetaminophen] Itching and Rash    The patient states she uses none for birth control. Last LMP was Patient's last menstrual period was 05/11/2021..   Current Problems (verified) Patient Active Problem List   Diagnosis Date Noted   Perimenopausal symptoms 05/17/2021   Primary insomnia 05/17/2021   Vitamin D deficiency 05/17/2021   Other abnormal glucose 05/17/2021   Acute non-recurrent frontal sinusitis 12/18/2018   Pleurisy 10/10/2018   Lower respiratory infection 10/10/2018   Cough 10/10/2018    Medications Prior to Visit Current Outpatient  Medications on File Prior to Visit  Medication Sig Dispense Refill   albuterol (VENTOLIN HFA) 108 (90 Base) MCG/ACT inhaler Inhale 2 puffs into the lungs every 6 (six) hours as needed for wheezing or shortness of breath. (Patient not taking: Reported on 05/13/2021) 18 g 1   No current facility-administered medications on file prior to visit.    Current Medications (verified) Current Outpatient Medications  Medication Sig Dispense Refill   albuterol (VENTOLIN HFA) 108 (90 Base) MCG/ACT inhaler Inhale 2 puffs into the lungs every 6 (six) hours as needed for wheezing or shortness of breath. (Patient not taking: Reported on 05/13/2021) 18 g 1   No current facility-administered medications for this visit.     Allergies (verified) Shellfish allergy, Iodine, and Percocet [oxycodone-acetaminophen]   PAST HISTORY  Family History Family History  Problem Relation Age of Onset   Diabetes Father    Diabetes Brother     Social History Social History   Tobacco Use   Smoking status: Former    Years: 20.00    Types: Cigarettes   Smokeless tobacco: Never  Substance Use Topics   Alcohol use: Yes    Comment: sometimes     Screening Tests Health Maintenance  Topic Date Due   COVID-19 Vaccine (3 - Booster for Pfizer series) 06/17/2020   PAP SMEAR-Modifier  05/08/2023   TETANUS/TDAP  06/01/2025   COLONOSCOPY (Pts 45-84yr Insurance coverage will need to be confirmed)  07/25/2030   INFLUENZA  VACCINE  Completed   Hepatitis C Screening  Completed   HIV Screening  Completed   HPV VACCINES  Aged Out   c Negative for: breast discharge, breast lump(s), breast pain and breast self exam. Associated symptoms include abnormal vaginal bleeding. Pertinent negatives include abnormal bleeding (hematology), anxiety, decreased libido, depression, difficulty falling sleep, dyspareunia, history of infertility, nocturia, sexual dysfunction, sleep disturbances, urinary incontinence, urinary urgency, vaginal  discharge and vaginal itching. Diet regular.The patient states her exercise level is  intermittent.  . The patient's tobacco use is:  Social History   Tobacco Use  Smoking Status Former   Years: 20.00   Types: Cigarettes  Smokeless Tobacco Never  . She has been exposed to passive smoke. The patient's alcohol use is:  Social History   Substance and Sexual Activity  Alcohol Use Yes   Comment: sometimes   Review of Systems  Constitutional: Negative.   HENT: Negative.    Eyes: Negative.   Respiratory: Negative.    Cardiovascular: Negative.   Gastrointestinal: Negative.   Endocrine: Negative.   Genitourinary: Negative.        She states she has been having worse menstrual symptoms all year - intermittent, increased cramping and PMS sx. She is not sure what may have triggered her sx.   Musculoskeletal: Negative.   Skin: Negative.   Allergic/Immunologic: Negative.   Neurological: Negative.   Hematological: Negative.   Psychiatric/Behavioral: Negative.      Today's Vitals   05/13/21 1043  BP: 118/80  Pulse: 82  Temp: 98.1 F (36.7 C)  Weight: 127 lb 6.4 oz (57.8 kg)  Height: 5' (1.524 m)  PainSc: 0-No pain   Body mass index is 24.88 kg/m.   Wt Readings from Last 3 Encounters:  05/13/21 127 lb 6.4 oz (57.8 kg)  05/07/20 139 lb 12.8 oz (63.4 kg)  02/15/20 138 lb (62.6 kg)     Objective:  Physical Exam Vitals and nursing note reviewed.  Constitutional:      Appearance: Normal appearance.  HENT:     Head: Normocephalic and atraumatic.     Right Ear: Tympanic membrane, ear canal and external ear normal.     Left Ear: Tympanic membrane, ear canal and external ear normal.     Nose:     Comments: Masked     Mouth/Throat:     Comments: Masked  Eyes:     Extraocular Movements: Extraocular movements intact.     Conjunctiva/sclera: Conjunctivae normal.     Pupils: Pupils are equal, round, and reactive to light.  Cardiovascular:     Rate and Rhythm: Normal rate and  regular rhythm.     Pulses: Normal pulses.     Heart sounds: Normal heart sounds.  Pulmonary:     Effort: Pulmonary effort is normal.     Breath sounds: Normal breath sounds.  Chest:  Breasts:    Tanner Score is 5.     Right: Normal.     Left: Normal.  Abdominal:     General: Abdomen is flat. Bowel sounds are normal.     Palpations: Abdomen is soft.  Genitourinary:    Comments: deferred Musculoskeletal:        General: Normal range of motion.     Cervical back: Normal range of motion and neck supple.  Skin:    General: Skin is warm and dry.  Neurological:     General: No focal deficit present.     Mental Status: She is alert and oriented to person,  place, and time.  Psychiatric:        Mood and Affect: Mood normal.        Behavior: Behavior normal.        Assessment And Plan:     1. Encounter for general adult medical examination w/o abnormal findings Comments: A full exam was performed. Importance of monthly self breast exams was discussed with the patient in full detail. She has lost 12 pounds since her last visit. She denies change in her exercise/eating regimen. PATIENT IS ADVISED TO GET 30-45 MINUTES REGULAR EXERCISE NO LESS THAN FOUR TO FIVE DAYS PER WEEK - BOTH WEIGHTBEARING EXERCISES AND AEROBIC ARE RECOMMENDED.  PATIENT IS ADVISED TO FOLLOW A HEALTHY DIET WITH AT LEAST SIX FRUITS/VEGGIES PER DAY, DECREASE INTAKE OF RED MEAT, AND TO INCREASE FISH INTAKE TO TWO DAYS PER WEEK.  MEATS/FISH SHOULD NOT BE FRIED, BAKED OR BROILED IS PREFERABLE.  IT IS ALSO IMPORTANT TO CUT BACK ON YOUR SUGAR INTAKE. PLEASE AVOID ANYTHING WITH ADDED SUGAR, CORN SYRUP OR OTHER SWEETENERS. IF YOU MUST USE A SWEETENER, YOU CAN TRY STEVIA. IT IS ALSO IMPORTANT TO AVOID ARTIFICIALLY SWEETENERS AND DIET BEVERAGES. LASTLY, I SUGGEST WEARING SPF 50 SUNSCREEN ON EXPOSED PARTS AND ESPECIALLY WHEN IN THE DIRECT SUNLIGHT FOR AN EXTENDED PERIOD OF TIME.  PLEASE AVOID FAST FOOD RESTAURANTS AND INCREASE YOUR WATER  INTAKE.   - CMP14+EGFR - CBC - Lipid panel  2. Perimenopausal symptoms Comments: We discussed the impact of diet on perimenopause, encouraged to follow clean eating plan. She will let me know if her sx persist. Will consider GYN eval if needed.  - TSH  3. Primary insomnia Comments: She was given samples of Belsomra. Her sx are likely related to perimenopause. Advised to follow good bedtime hygiene. She may also benefit from magnesium glycinate supplementation nightly.   4. Other abnormal glucose Comments: Her a1c has been elevated in the past. I will recheck this today. Encouraged to limit her intake of sweetened beverages, including diet drinks.  - Hemoglobin A1c  5. Immunization due - Flu Vaccine QUAD 6+ mos PF IM (Fluarix Quad PF)  Patient was given opportunity to ask questions. Patient verbalized understanding of the plan and was able to repeat key elements of the plan. All questions were answered to their satisfaction.   I, [name], MD, have reviewed all documentation for this visit. The documentation on 05/13/21 for the exam, diagnosis, procedures, and orders are all accurate and complete.   THE PATIENT IS ENCOURAGED TO PRACTICE SOCIAL DISTANCING DUE TO THE COVID-19 PANDEMIC.

## 2021-05-13 NOTE — Patient Instructions (Signed)
Health Maintenance, Female Adopting a healthy lifestyle and getting preventive care are important in promoting health and wellness. Ask your health care provider about: The right schedule for you to have regular tests and exams. Things you can do on your own to prevent diseases and keep yourself healthy. What should I know about diet, weight, and exercise? Eat a healthy diet  Eat a diet that includes plenty of vegetables, fruits, low-fat dairy products, and lean protein. Do not eat a lot of foods that are high in solid fats, added sugars, or sodium. Maintain a healthy weight Body mass index (BMI) is used to identify weight problems. It estimates body fat based on height and weight. Your health care provider can help determine your BMI and help you achieve or maintain a healthy weight. Get regular exercise Get regular exercise. This is one of the most important things you can do for your health. Most adults should: Exercise for at least 150 minutes each week. The exercise should increase your heart rate and make you sweat (moderate-intensity exercise). Do strengthening exercises at least twice a week. This is in addition to the moderate-intensity exercise. Spend less time sitting. Even light physical activity can be beneficial. Watch cholesterol and blood lipids Have your blood tested for lipids and cholesterol at 47 years of age, then have this test every 5 years. Have your cholesterol levels checked more often if: Your lipid or cholesterol levels are high. You are older than 47 years of age. You are at high risk for heart disease. What should I know about cancer screening? Depending on your health history and family history, you may need to have cancer screening at various ages. This may include screening for: Breast cancer. Cervical cancer. Colorectal cancer. Skin cancer. Lung cancer. What should I know about heart disease, diabetes, and high blood pressure? Blood pressure and heart  disease High blood pressure causes heart disease and increases the risk of stroke. This is more likely to develop in people who have high blood pressure readings, are of African descent, or are overweight. Have your blood pressure checked: Every 3-5 years if you are 18-39 years of age. Every year if you are 40 years old or older. Diabetes Have regular diabetes screenings. This checks your fasting blood sugar level. Have the screening done: Once every three years after age 40 if you are at a normal weight and have a low risk for diabetes. More often and at a younger age if you are overweight or have a high risk for diabetes. What should I know about preventing infection? Hepatitis B If you have a higher risk for hepatitis B, you should be screened for this virus. Talk with your health care provider to find out if you are at risk for hepatitis B infection. Hepatitis C Testing is recommended for: Everyone born from 1945 through 1965. Anyone with known risk factors for hepatitis C. Sexually transmitted infections (STIs) Get screened for STIs, including gonorrhea and chlamydia, if: You are sexually active and are younger than 47 years of age. You are older than 47 years of age and your health care provider tells you that you are at risk for this type of infection. Your sexual activity has changed since you were last screened, and you are at increased risk for chlamydia or gonorrhea. Ask your health care provider if you are at risk. Ask your health care provider about whether you are at high risk for HIV. Your health care provider may recommend a prescription medicine   to help prevent HIV infection. If you choose to take medicine to prevent HIV, you should first get tested for HIV. You should then be tested every 3 months for as long as you are taking the medicine. Pregnancy If you are about to stop having your period (premenopausal) and you may become pregnant, seek counseling before you get  pregnant. Take 400 to 800 micrograms (mcg) of folic acid every day if you become pregnant. Ask for birth control (contraception) if you want to prevent pregnancy. Osteoporosis and menopause Osteoporosis is a disease in which the bones lose minerals and strength with aging. This can result in bone fractures. If you are 65 years old or older, or if you are at risk for osteoporosis and fractures, ask your health care provider if you should: Be screened for bone loss. Take a calcium or vitamin D supplement to lower your risk of fractures. Be given hormone replacement therapy (HRT) to treat symptoms of menopause. Follow these instructions at home: Lifestyle Do not use any products that contain nicotine or tobacco, such as cigarettes, e-cigarettes, and chewing tobacco. If you need help quitting, ask your health care provider. Do not use street drugs. Do not share needles. Ask your health care provider for help if you need support or information about quitting drugs. Alcohol use Do not drink alcohol if: Your health care provider tells you not to drink. You are pregnant, may be pregnant, or are planning to become pregnant. If you drink alcohol: Limit how much you use to 0-1 drink a day. Limit intake if you are breastfeeding. Be aware of how much alcohol is in your drink. In the [place] one drink equals one 12 oz bottle of beer (355 mL), one 5 oz glass of wine (148 mL), or one 1 oz glass of hard liquor (44 mL). General instructions Schedule regular health, dental, and eye exams. Stay current with your vaccines. Tell your health care provider if: You often feel depressed. You have ever been abused or do not feel safe at home. Summary Adopting a healthy lifestyle and getting preventive care are important in promoting health and wellness. Follow your health care provider's instructions about healthy diet, exercising, and getting tested or screened for diseases. Follow your health care provider's  instructions on monitoring your cholesterol and blood pressure. This information is not intended to replace advice given to you by your health care provider. Make sure you discuss any questions you have with your health care provider. Document Revised: 10/03/2020 Document Reviewed: 07/19/2018 Elsevier Patient Education  2022 Elsevier Inc.  

## 2021-05-14 LAB — CMP14+EGFR
ALT: 16 IU/L (ref 0–32)
AST: 16 IU/L (ref 0–40)
Albumin/Globulin Ratio: 1.6 (ref 1.2–2.2)
Albumin: 4.1 g/dL (ref 3.8–4.8)
Alkaline Phosphatase: 120 IU/L (ref 44–121)
BUN/Creatinine Ratio: 13 (ref 9–23)
BUN: 9 mg/dL (ref 6–24)
Bilirubin Total: 0.3 mg/dL (ref 0.0–1.2)
CO2: 23 mmol/L (ref 20–29)
Calcium: 9.4 mg/dL (ref 8.7–10.2)
Chloride: 101 mmol/L (ref 96–106)
Creatinine, Ser: 0.71 mg/dL (ref 0.57–1.00)
Globulin, Total: 2.6 g/dL (ref 1.5–4.5)
Glucose: 110 mg/dL — ABNORMAL HIGH (ref 70–99)
Potassium: 4.3 mmol/L (ref 3.5–5.2)
Sodium: 136 mmol/L (ref 134–144)
Total Protein: 6.7 g/dL (ref 6.0–8.5)
eGFR: 105 mL/min/{1.73_m2} (ref >59)

## 2021-05-14 LAB — HEMOGLOBIN A1C
Est. average glucose Bld gHb Est-mCnc: 120 mg/dL
Hgb A1c MFr Bld: 5.8 % — ABNORMAL HIGH (ref 4.8–5.6)

## 2021-05-14 LAB — CBC
Hematocrit: 39.1 % (ref 34.0–46.6)
Hemoglobin: 13.2 g/dL (ref 11.1–15.9)
MCH: 31.7 pg (ref 26.6–33.0)
MCHC: 33.8 g/dL (ref 31.5–35.7)
MCV: 94 fL (ref 79–97)
Platelets: 356 10*3/uL (ref 150–450)
RBC: 4.17 x10E6/uL (ref 3.77–5.28)
RDW: 11.8 % (ref 11.7–15.4)
WBC: 7 10*3/uL (ref 3.4–10.8)

## 2021-05-14 LAB — LIPID PANEL
Chol/HDL Ratio: 2.9 ratio (ref 0.0–4.4)
Cholesterol, Total: 163 mg/dL (ref 100–199)
HDL: 57 mg/dL (ref >39)
LDL Chol Calc (NIH): 93 mg/dL (ref 0–99)
Triglycerides: 66 mg/dL (ref 0–149)
VLDL Cholesterol Cal: 13 mg/dL (ref 5–40)

## 2021-05-14 LAB — TSH: TSH: 0.896 u[IU]/mL (ref 0.450–4.500)

## 2021-05-17 ENCOUNTER — Encounter: Payer: Self-pay | Admitting: Internal Medicine

## 2021-05-17 DIAGNOSIS — E559 Vitamin D deficiency, unspecified: Secondary | ICD-10-CM | POA: Insufficient documentation

## 2021-05-17 DIAGNOSIS — F5101 Primary insomnia: Secondary | ICD-10-CM | POA: Insufficient documentation

## 2021-05-17 DIAGNOSIS — N951 Menopausal and female climacteric states: Secondary | ICD-10-CM | POA: Insufficient documentation

## 2021-05-17 DIAGNOSIS — R7309 Other abnormal glucose: Secondary | ICD-10-CM | POA: Insufficient documentation

## 2021-05-19 LAB — T4, FREE: Free T4: 1.46 ng/dL (ref 0.82–1.77)

## 2021-05-19 LAB — SPECIMEN STATUS REPORT

## 2021-09-06 ENCOUNTER — Encounter: Payer: Self-pay | Admitting: Emergency Medicine

## 2021-09-06 ENCOUNTER — Other Ambulatory Visit: Payer: Self-pay

## 2021-09-06 ENCOUNTER — Ambulatory Visit
Admission: EM | Admit: 2021-09-06 | Discharge: 2021-09-06 | Disposition: A | Discharge status: Home / Self Care | Payer: BC Managed Care – PPO | Attending: Internal Medicine | Admitting: Internal Medicine

## 2021-09-06 DIAGNOSIS — R062 Wheezing: Secondary | ICD-10-CM | POA: Diagnosis not present

## 2021-09-06 DIAGNOSIS — J069 Acute upper respiratory infection, unspecified: Secondary | ICD-10-CM

## 2021-09-06 MED ORDER — PREDNISONE 10 MG (21) PO TBPK: ORAL_TABLET | Freq: Every day | ORAL | 0 refills | Status: DC

## 2021-09-06 MED ORDER — ALBUTEROL SULFATE HFA 108 (90 BASE) MCG/ACT IN AERS: 1.0000 | INHALATION_SPRAY | Freq: Four times a day (QID) | RESPIRATORY_TRACT | 0 refills | Status: DC | PRN

## 2021-09-06 MED ORDER — BENZONATATE 100 MG PO CAPS: 100.0000 mg | ORAL_CAPSULE | Freq: Three times a day (TID) | ORAL | 0 refills | Status: DC | PRN

## 2021-09-06 NOTE — ED Triage Notes (Signed)
Patient c/o cough, wheezing, congestion, afebrile, runny nose x 5 days.  Patient has taken Mucinex and OTC sinus pressure.

## 2021-09-06 NOTE — Discharge Instructions (Signed)
You have a viral upper respiratory infection on exam.  You have been prescribed 3 medications to help alleviate symptoms.  COVID-19 and flu test is pending.  We will call if it is positive.

## 2021-09-06 NOTE — ED Provider Notes (Signed)
[facility]    CSN: [id number] Arrival date & time: 09/06/21  1248      History   Chief Complaint Chief Complaint  Patient presents with   Cough    HPI ICELA GLYMPH is a 46 y.o. female.   Patient presents with 5-day history of nonproductive cough, wheezing, nasal congestion, runny nose.  Denies any known fevers or sick contacts.  Patient denies chest pain, shortness of breath, sore throat, ear pain, nausea, vomiting, diarrhea, abdominal pain.  Patient has taken Mucinex and over-the-counter cold and flu medications with minimal improvement in symptoms.  She denies history of asthma or COPD.  She did have a history of wheezing when she had COVID previously with a pulmonary referral created by PCP.  Patient reports that she never saw a pulmonologist.   Cough  Past Medical History:  Diagnosis Date   COVID-19     Patient Active Problem List   Diagnosis Date Noted   Perimenopausal symptoms 05/17/2021   Primary insomnia 05/17/2021   Vitamin D deficiency 05/17/2021   Other abnormal glucose 05/17/2021   Acute non-recurrent frontal sinusitis 12/18/2018   Pleurisy 10/10/2018   Lower respiratory infection 10/10/2018   Cough 10/10/2018    Past Surgical History:  Procedure Laterality Date   CESAREAN SECTION  2001   TENDON REPAIR  2010    OB History   No obstetric history on file.      Home Medications    Prior to Admission medications   Medication Sig Start Date End Date Taking? Authorizing Provider  albuterol (VENTOLIN HFA) 108 (90 Base) MCG/ACT inhaler Inhale 1-2 puffs into the lungs every 6 (six) hours as needed for wheezing or shortness of breath. 09/06/21  Yes Kilee Hedding, Black E, FNP  benzonatate (TESSALON) 100 MG capsule Take 1 capsule (100 mg total) by mouth every 8 (eight) hours as needed for cough. 09/06/21  Yes Rye Dorado, Rolly Salter E, FNP  predniSONE (STERAPRED UNI-PAK 21 TAB) 10 MG (21) TBPK tablet Take by mouth daily. Take 6 tabs by mouth daily  for 2 days,  then 5 tabs for 2 days, then 4 tabs for 2 days, then 3 tabs for 2 days, 2 tabs for 2 days, then 1 tab by mouth daily for 2 days 09/06/21  Yes Annelisa Ryback, Acie Fredrickson, FNP    Family History Family History  Problem Relation Age of Onset   Diabetes Father    Diabetes Brother     Social History Social History   Tobacco Use   Smoking status: Former    Years: 20.00    Types: Cigarettes   Smokeless tobacco: Never  Substance Use Topics   Alcohol use: Yes    Comment: sometimes   Drug use: Never     Allergies   Shellfish allergy, Iodine, and Percocet [oxycodone-acetaminophen]   Review of Systems Review of Systems Per HPI  Physical Exam Triage Vital Signs ED Triage Vitals  Enc Vitals Group     BP 09/06/21 1312 (!) 164/52     Pulse Rate 09/06/21 1312 95     Resp 09/06/21 1312 20     Temp 09/06/21 1312 98.1 F (36.7 C)     Temp Source 09/06/21 1312 Oral     SpO2 09/06/21 1312 95 %     Weight 09/06/21 1314 125 lb (56.7 kg)     Height 09/06/21 1314 5' (1.524 m)     Head Circumference --      Peak Flow --  Pain Score 09/06/21 1313 2     Pain Loc --      Pain Edu? --      Excl. in GC? --    No data found.  Updated Vital Signs BP (!) 164/52 (BP Location: Left Arm)    Pulse 95    Temp 98.1 F (36.7 C) (Oral)    Resp 20    Ht 5' (1.524 m)    Wt 125 lb (56.7 kg)    LMP 08/18/2021    SpO2 95%    BMI 24.41 kg/m   Visual Acuity Right Eye Distance:   Left Eye Distance:   Bilateral Distance:    Right Eye Near:   Left Eye Near:    Bilateral Near:     Physical Exam Constitutional:      General: She is not in acute distress.    Appearance: Normal appearance. She is not toxic-appearing or diaphoretic.  HENT:     Head: Normocephalic and atraumatic.     Right Ear: Tympanic membrane and ear canal normal.     Left Ear: Tympanic membrane and ear canal normal.     Nose: Congestion present.     Mouth/Throat:     Mouth: Mucous membranes are moist.     Pharynx: No posterior  oropharyngeal erythema.  Eyes:     Extraocular Movements: Extraocular movements intact.     Conjunctiva/sclera: Conjunctivae normal.     Pupils: Pupils are equal, round, and reactive to light.  Cardiovascular:     Rate and Rhythm: Normal rate and regular rhythm.     Pulses: Normal pulses.     Heart sounds: Normal heart sounds.  Pulmonary:     Effort: Pulmonary effort is normal. No respiratory distress.     Breath sounds: No stridor. Wheezing present. No rhonchi or rales.  Abdominal:     General: Abdomen is flat. Bowel sounds are normal.     Palpations: Abdomen is soft.  Musculoskeletal:        General: Normal range of motion.     Cervical back: Normal range of motion.  Skin:    General: Skin is warm and dry.  Neurological:     General: No focal deficit present.     Mental Status: She is alert and oriented to person, place, and time. Mental status is at baseline.  Psychiatric:        Mood and Affect: Mood normal.        Behavior: Behavior normal.     UC Treatments / Results  Labs (all labs ordered are listed, but only abnormal results are displayed) Labs Reviewed  COVID-19, FLU A+B NAA    EKG   Radiology No results found.  Procedures Procedures (including critical care time)  Medications Ordered in UC Medications - No data to display  Initial Impression / Assessment and Plan / UC Course  I have reviewed the triage vital signs and the nursing notes.  Pertinent labs & imaging results that were available during my care of the patient were reviewed by me and considered in my medical decision making (see chart for details).     Patient presents with symptoms likely from a viral upper respiratory infection. Differential includes bacterial pneumonia, sinusitis, allergic rhinitis, COVID-19, flu. Do not suspect underlying cardiopulmonary process. Symptoms seem unlikely related to ACS, CHF or COPD exacerbations, pneumonia, pneumothorax. Patient is nontoxic appearing and  not in need of emergent medical intervention.  COVID-19 and flu test pending.  Recommended symptom control with  over the counter medications.  Albuterol and prednisone prescribed to help alleviate wheezing and inflammation.  Return if symptoms fail to improve in 1-2 weeks or you develop shortness of breath, chest pain, severe headache. Patient states understanding and is agreeable.  Discharged with PCP followup.  Final Clinical Impressions(s) / UC Diagnoses   Final diagnoses:  Viral upper respiratory tract infection with cough  Wheezing     Discharge Instructions      You have a viral upper respiratory infection on exam.  You have been prescribed 3 medications to help alleviate symptoms.  COVID-19 and flu test is pending.  We will call if it is positive.    ED Prescriptions     Medication Sig Dispense Auth. Provider   albuterol (VENTOLIN HFA) 108 (90 Base) MCG/ACT inhaler Inhale 1-2 puffs into the lungs every 6 (six) hours as needed for wheezing or shortness of breath. 1 each [place], Rolly SalterHaley E, [place]NP   predniSONE (STERAPRED UNI-PAK 21 TAB) 10 MG (21) TBPK tablet Take by mouth daily. Take 6 tabs by mouth daily  for 2 days, then 5 tabs for 2 days, then 4 tabs for 2 days, then 3 tabs for 2 days, 2 tabs for 2 days, then 1 tab by mouth daily for 2 days 42 tablet Azharia Surratt, Falling WatersHaley E, [place]NP   benzonatate (TESSALON) 100 MG capsule Take 1 capsule (100 mg total) by mouth every 8 (eight) hours as needed for cough. 21 capsule [place], Acie FredricksonHaley E, [place]FNP      PDMP not reviewed this encounter.   Gustavus BryantMound, Itzae Mccurdy E, [place]FNP 09/06/21 1343

## 2021-09-07 LAB — COVID-19, FLU A+B NAA
Influenza A, NAA: NOT DETECTED
Influenza B, NAA: NOT DETECTED
SARS-CoV-2, NAA: NOT DETECTED

## 2021-09-10 ENCOUNTER — Encounter: Payer: Self-pay | Admitting: Emergency Medicine

## 2021-09-10 ENCOUNTER — Ambulatory Visit (INDEPENDENT_AMBULATORY_CARE_PROVIDER_SITE_OTHER): Payer: BC Managed Care – PPO

## 2021-09-10 ENCOUNTER — Other Ambulatory Visit: Payer: Self-pay

## 2021-09-10 ENCOUNTER — Ambulatory Visit
Admission: EM | Admit: 2021-09-10 | Discharge: 2021-09-10 | Disposition: A | Discharge status: Home / Self Care | Payer: BC Managed Care – PPO | Attending: Internal Medicine | Admitting: Internal Medicine

## 2021-09-10 ENCOUNTER — Ambulatory Visit
Admission: EM | Admit: 2021-09-10 | Discharge: 2021-09-10 | Discharge status: Absent without leave | Payer: BC Managed Care – PPO

## 2021-09-10 DIAGNOSIS — R059 Cough, unspecified: Secondary | ICD-10-CM | POA: Diagnosis not present

## 2021-09-10 DIAGNOSIS — B9789 Other viral agents as the cause of diseases classified elsewhere: Secondary | ICD-10-CM

## 2021-09-10 DIAGNOSIS — R0602 Shortness of breath: Secondary | ICD-10-CM

## 2021-09-10 DIAGNOSIS — R062 Wheezing: Secondary | ICD-10-CM | POA: Diagnosis not present

## 2021-09-10 DIAGNOSIS — J988 Other specified respiratory disorders: Secondary | ICD-10-CM

## 2021-09-10 DIAGNOSIS — R051 Acute cough: Secondary | ICD-10-CM

## 2021-09-10 MED ORDER — PROMETHAZINE-DM 6.25-15 MG/5ML PO SYRP: 5.0000 mL | ORAL_SOLUTION | Freq: Four times a day (QID) | ORAL | 0 refills | Status: DC | PRN

## 2021-09-10 MED ORDER — ALBUTEROL SULFATE (2.5 MG/3ML) 0.083% IN NEBU
2.5000 mg | INHALATION_SOLUTION | Freq: Once | RESPIRATORY_TRACT | Status: AC
  Administered 2021-09-10: 2.5 mg via RESPIRATORY_TRACT

## 2021-09-10 MED ORDER — AZITHROMYCIN 500 MG PO TABS: ORAL_TABLET | ORAL | 0 refills | Status: AC

## 2021-09-10 NOTE — Discharge Instructions (Signed)
You have been prescribed antibiotic and a cough medication.  Please be advised that cough medication can cause drowsiness.  Follow-up if symptoms persist or worsen.

## 2021-09-10 NOTE — ED Triage Notes (Signed)
Here recently for same symptoms. SOB, cough, nasal congestion, tested negative for flu/covid. Cough is better after prednisone. Deep cough heard in triage. Has been taking mucinex. States her cough has improved significantly since Sunday due to the prednisone.

## 2021-09-10 NOTE — ED Provider Notes (Signed)
[facility]    CSN: [id number] Arrival date & time: 09/10/21  [id number]      History   Chief Complaint No chief complaint on file.   HPI [name] is a 46 y.o. female.   Patient presents for further evaluation of shortness of breath, cough, nasal congestion.  Patient was seen on 09/06/2021 for same symptoms.  She tested negative for COVID and flu.  She was prescribed prednisone and Tessalon Perles for wheezing on exam given that she has a history of asthma.  She reports that she has seen improvement with prednisone but symptoms not completely resolved.  Has also been using her albuterol inhaler as needed for wheezing or shortness of breath.  She has been taking Mucinex as well.  Shortness of breath is intermittent.  Denies chest pain, sore throat, ear pain, nausea, vomiting, diarrhea, abdominal pain.  She does report that she is still taking prednisone.    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      Home Medications    Prior to Admission medications   Medication Sig Start Date End Date Taking? Authorizing Provider  azithromycin (ZITHROMAX) 500 MG tablet Take 1 tablet (500 mg total) by mouth daily for 1 day, THEN 0.5 tablets (250 mg total) daily for 4 days. 09/10/21 09/15/21 Yes Quinteria Chisum, Michele Rockers, FNP  promethazine-dextromethorphan (PROMETHAZINE-DM) 6.25-15 MG/5ML syrup Take 5 mLs by mouth 4 (four) times daily as needed for cough. 09/10/21  Yes Avian Greenawalt, Michele Rockers, FNP  albuterol (VENTOLIN  HFA) 108 (90 Base) MCG/ACT inhaler Inhale 1-2 puffs into the lungs every 6 (six) hours as needed for wheezing or shortness of breath. 09/06/21   Teodora Medici, FNP  benzonatate (TESSALON) 100 MG capsule Take 1 capsule (100 mg total) by mouth every 8 (eight) hours as needed for cough. 09/06/21   Teodora Medici, FNP  predniSONE (STERAPRED UNI-PAK 21 TAB) 10 MG (21) TBPK tablet Take by mouth daily. Take 6 tabs by mouth daily  for 2 days, then 5 tabs for 2 days, then 4 tabs for 2 days, then 3 tabs for 2 days, 2 tabs for 2 days, then 1 tab by mouth daily for 2 days 09/06/21   Teodora Medici, FNP    Family History Family History  Problem Relation Age of Onset   Diabetes Father    Diabetes Brother     Social History Social History   Tobacco Use   Smoking status: Former    Years: 20.00    Types: Cigarettes   Smokeless tobacco: Never  Substance Use Topics   Alcohol use: Yes    Comment: sometimes   Drug use: Never     Allergies   Shellfish allergy, Iodine, and Percocet [oxycodone-acetaminophen]   Review of Systems Review of Systems Per HPI  Physical Exam Triage Vital Signs ED Triage Vitals  Enc Vitals Group     BP 09/10/21 1058 (!) 168/100     Pulse Rate  09/10/21 1058 68     Resp 09/10/21 1058 16     Temp 09/10/21 1058 98.2 F (36.8 C)     Temp Source 09/10/21 1058 Oral     SpO2 09/10/21 1058 95 %     Weight --      Height --      Head Circumference --      Peak Flow --      Pain Score 09/10/21 1101 2     Pain Loc --      Pain Edu? --      Excl. in [place]? --    No data found.  Updated Vital Signs BP (!) 168/100 (BP Location: Right Arm)    Pulse 68    Temp 98.2 F (36.8 C) (Oral)    Resp 16    LMP 08/18/2021    SpO2 95%   Visual Acuity Right Eye Distance:   Left Eye Distance:   Bilateral Distance:    Right Eye Near:   Left Eye Near:    Bilateral Near:     Physical Exam Constitutional:      General: She is not in acute distress.    Appearance: Normal  appearance. She is not toxic-appearing or diaphoretic.  HENT:     Head: Normocephalic and atraumatic.     Right Ear: Tympanic membrane and ear canal normal.     Left Ear: Tympanic membrane and ear canal normal.     Nose: Congestion present.     Mouth/Throat:     Mouth: Mucous membranes are moist.     Pharynx: No posterior oropharyngeal erythema.  Eyes:     Extraocular Movements: Extraocular movements intact.     Conjunctiva/sclera: Conjunctivae normal.     Pupils: Pupils are equal, round, and reactive to light.  Cardiovascular:     Rate and Rhythm: Normal rate and regular rhythm.     Pulses: Normal pulses.     Heart sounds: Normal heart sounds.  Pulmonary:     Effort: Pulmonary effort is normal. No respiratory distress.     Breath sounds: No stridor. Wheezing present. No rhonchi or rales.  Abdominal:     General: Abdomen is flat. Bowel sounds are normal.     Palpations: Abdomen is soft.  Musculoskeletal:        General: Normal range of motion.     Cervical back: Normal range of motion.  Skin:    General: Skin is warm and dry.  Neurological:     General: No focal deficit present.     Mental Status: She is alert and oriented to person, place, and time. Mental status is at baseline.  Psychiatric:        Mood and Affect: Mood normal.        Behavior: Behavior normal.     UC Treatments / Results  Labs (all labs ordered are listed, but only abnormal results are displayed) Labs Reviewed - No data to display  EKG   Radiology DG Chest 2 View  Result Date: 09/10/2021 CLINICAL DATA:  Cough, shortness of breath EXAM: CHEST - 2 VIEW COMPARISON:  10/10/2018 FINDINGS: The heart size and mediastinal contours are within normal limits. Minimal diffuse interstitial opacity. The visualized skeletal structures are unremarkable. IMPRESSION: Minimal diffuse interstitial opacity, suggesting atypical or viral infection. No focal airspace opacity. Electronically Signed   By: Delanna Ahmadi M.D.    On: 09/10/2021 11:27    Procedures Procedures (including critical care time)  Medications Ordered in UC Medications  albuterol (PROVENTIL) (2.5 MG/3ML) 0.083% nebulizer solution 2.5 mg (2.5 mg Nebulization Given 09/10/21 1136)    Initial Impression / Assessment and Plan / UC Course  I have reviewed the triage vital signs and the nursing notes.  Pertinent labs & imaging results that were available during my care of the patient were reviewed by me and considered in my medical decision making (see chart for details).     Chest x-ray shows minimal diffuse interstitial opacity that could suggest atypical or viral infection.  Will cover for atypical with azithromycin antibiotic.  Albuterol nebulizer treatment given in urgent care today to help alleviate wheezing.  Patient to continue prednisone and complete.  Patient to use albuterol inhaler as needed at home.  Promethazine DM prescribed to take as needed for cough.  Discussed return precautions.  Patient verbalized understanding and was agreeable with plan. Final Clinical Impressions(s) / UC Diagnoses   Final diagnoses:  Viral respiratory infection  Acute cough  Wheezing     Discharge Instructions      You have been prescribed antibiotic and a cough medication.  Please be advised that cough medication can cause drowsiness.  Follow-up if symptoms persist or worsen.     ED Prescriptions     Medication Sig Dispense Auth. Provider   azithromycin (ZITHROMAX) 500 MG tablet Take 1 tablet (500 mg total) by mouth daily for 1 day, THEN 0.5 tablets (250 mg total) daily for 4 days. 3 tablet [place], [place], [place]   promethazine-dextromethorphan (PROMETHAZINE-DM) 6.25-15 MG/5ML syrup Take 5 mLs by mouth 4 (four) times daily as needed for cough. 118 mL Teodora Medici, [place]      PDMP not reviewed this encounter.   Teodora Medici, [place] 09/10/21 1200

## 2022-05-26 ENCOUNTER — Encounter: Payer: Self-pay | Admitting: Internal Medicine

## 2022-05-26 ENCOUNTER — Ambulatory Visit (INDEPENDENT_AMBULATORY_CARE_PROVIDER_SITE_OTHER): Payer: BC Managed Care – PPO | Admitting: Internal Medicine

## 2022-05-26 VITALS — BP 120/78 | HR 75 | Temp 98.2°F | Ht 60.6 in | Wt 130.0 lb

## 2022-05-26 DIAGNOSIS — F4321 Adjustment disorder with depressed mood: Secondary | ICD-10-CM | POA: Diagnosis not present

## 2022-05-26 DIAGNOSIS — Z1231 Encounter for screening mammogram for malignant neoplasm of breast: Secondary | ICD-10-CM

## 2022-05-26 DIAGNOSIS — Z23 Encounter for immunization: Secondary | ICD-10-CM

## 2022-05-26 DIAGNOSIS — Z0000 Encounter for general adult medical examination without abnormal findings: Secondary | ICD-10-CM | POA: Diagnosis not present

## 2022-05-26 DIAGNOSIS — F5102 Adjustment insomnia: Secondary | ICD-10-CM | POA: Diagnosis not present

## 2022-05-26 DIAGNOSIS — Z6824 Body mass index (BMI) 24.0-24.9, adult: Secondary | ICD-10-CM | POA: Diagnosis not present

## 2022-05-26 NOTE — Patient Instructions (Signed)

## 2022-05-26 NOTE — Progress Notes (Signed)
Rich Brave [place]leton,acting as a Education administrator for [name], MD.,have documented all relevant documentation on the behalf of [name], MD,as directed by  [name], MD while in the presence of [name], MD.   Subjective:     Patient ID: [name] , female    DOB: 12/07/1975 , 46 y.o.   MRN: [id number]   Chief Complaint  Patient presents with   Annual Exam    HPI  Patient presents today for her HM.  Her last pap smear was in 2021.  She denies having any headaches, chest pain and shortness of breath.      Past Medical History:  Diagnosis Date   COVID-19      Family History  Problem Relation Age of Onset   Diabetes Father    Diabetes Brother      Current Outpatient Medications:    albuterol (VENTOLIN HFA) 108 (90 Base) MCG/ACT inhaler, Inhale 1-2 puffs into the lungs every 6 (six) hours as needed for wheezing or shortness of breath., Disp: 1 each, Rfl: 0   Allergies  Allergen Reactions   Shellfish Allergy Anaphylaxis, Hives and Itching   Iodine Itching    hives   Percocet [Oxycodone-Acetaminophen] Itching and Rash      The patient states she uses none for birth control. Last LMP was Patient's last menstrual period was 05/17/2022.. Negative for Dysmenorrhea. Negative for: breast discharge, breast lump(s), breast pain and breast self exam. Associated symptoms include abnormal vaginal bleeding. Pertinent negatives include abnormal bleeding (hematology), anxiety, decreased libido, depression, difficulty falling sleep, dyspareunia, history of infertility, nocturia, sexual dysfunction, sleep disturbances, urinary incontinence, urinary urgency, vaginal discharge and vaginal itching. Diet regular.   . The patient's tobacco use is:  Social History   Tobacco Use  Smoking Status Former   Years: 20.00   Types: Cigarettes  Smokeless Tobacco Never  . She has been exposed to passive smoke. The patient's alcohol use is:  Social History   Substance and  Sexual Activity  Alcohol Use Yes   Comment: sometimes   Review of Systems  Constitutional: Negative.   HENT: Negative.    Eyes: Negative.   Respiratory: Negative.    Cardiovascular: Negative.   Gastrointestinal: Negative.   Endocrine: Negative.   Genitourinary: Negative.   Musculoskeletal: Negative.   Skin: Negative.   Allergic/Immunologic: Negative.   Neurological: Negative.   Hematological: Negative.   Psychiatric/Behavioral:  Positive for dysphoric mood and sleep disturbance.        She admits she is under a great deal of stress at work. There have been a lot of layoffs, but she was fortunately to keep her job. However, she now has a new position. She is also recently divorced.      Today's Vitals   05/26/22 1002  BP: 120/78  Pulse: 75  Temp: 98.2 F (36.8 C)  Weight: 130 lb (59 kg)  Height: 5' 0.6" (1.539 m)  PainSc: 0-No pain   Body mass index is 24.89 kg/m.  Wt Readings from Last 3 Encounters:  05/26/22 130 lb (59 kg)  09/06/21 125 lb (56.7 kg)  05/13/21 127 lb 6.4 oz (57.8 kg)     Objective:  Physical Exam Vitals and nursing note reviewed.  Constitutional:      Appearance: Normal appearance.  HENT:     Head: Normocephalic and atraumatic.     Right Ear: Tympanic membrane, ear canal and external ear normal.     Left Ear: Tympanic membrane, ear  canal and external ear normal.     Nose:     Comments: Masked     Mouth/Throat:     Comments: Masked  Eyes:     Extraocular Movements: Extraocular movements intact.     Conjunctiva/sclera: Conjunctivae normal.     Pupils: Pupils are equal, round, and reactive to light.  Cardiovascular:     Rate and Rhythm: Normal rate and regular rhythm.     Pulses: Normal pulses.     Heart sounds: Normal heart sounds.  Pulmonary:     Effort: Pulmonary effort is normal.     Breath sounds: Normal breath sounds.  Chest:  Breasts:    Tanner Score is 5.     Right: Normal.     Left: Normal.  Abdominal:     General: Abdomen  is flat. Bowel sounds are normal.     Palpations: Abdomen is soft.  Genitourinary:    Comments: deferred Musculoskeletal:        General: Normal range of motion.     Cervical back: Normal range of motion and neck supple.  Skin:    General: Skin is warm and dry.  Neurological:     General: No focal deficit present.     Mental Status: She is alert and oriented to person, place, and time.  Psychiatric:        Mood and Affect: Mood normal.        Behavior: Behavior normal.      Assessment And Plan:     1. Encounter for general adult medical examination w/o abnormal findings Comments: A full exam was performed. Importance of monthly self breast exams was discussed with the patient. PATIENT IS ADVISED TO GET 30-45 MINUTES REGULAR EXERCISE NO LESS THAN FOUR TO FIVE DAYS PER WEEK - BOTH WEIGHTBEARING EXERCISES AND AEROBIC ARE RECOMMENDED.  PATIENT IS ADVISED TO FOLLOW A HEALTHY DIET WITH AT LEAST SIX FRUITS/VEGGIES PER DAY, DECREASE INTAKE OF RED MEAT, AND TO INCREASE FISH INTAKE TO TWO DAYS PER WEEK.  MEATS/FISH SHOULD NOT BE FRIED, BAKED OR BROILED IS PREFERABLE.  IT IS ALSO IMPORTANT TO CUT BACK ON YOUR SUGAR INTAKE. PLEASE AVOID ANYTHING WITH ADDED SUGAR, CORN SYRUP OR OTHER SWEETENERS. IF YOU MUST USE A SWEETENER, YOU CAN TRY STEVIA. IT IS ALSO IMPORTANT TO AVOID ARTIFICIALLY SWEETENERS AND DIET BEVERAGES. LASTLY, I SUGGEST WEARING SPF 50 SUNSCREEN ON EXPOSED PARTS AND ESPECIALLY WHEN IN THE DIRECT SUNLIGHT FOR AN EXTENDED PERIOD OF TIME.  PLEASE AVOID FAST FOOD RESTAURANTS AND INCREASE YOUR WATER INTAKE. - CBC - CMP14+EGFR - Lipid panel - Hemoglobin A1c  2. Adjustment disorder with depressed mood Comments: She is encouraged to seek therapy through her job's EAP program. She agrees to f/u in 3 months.  - TSH  3. Adjustment insomnia Comments: She was given samples of Belsomra, 79m to use nightly prn. She is encouraged to touch base in a week or two to discuss effectiveness of meds.   4.  BMI 24.0-24.9, adult Comments: She is encouraged to aim for at least 150 minutes of exercise per week.   5. Encounter for screening mammogram for malignant neoplasm of breast Comments: I will refer her for mammogram, she is overdue.  - MM DIGITAL SCREENING BILATERAL; Future  6. Immunization due - Flu Vaccine QUAD 6+ mos PF IM (Fluarix Quad PF)   Patient was given opportunity to ask questions. Patient verbalized understanding of the plan and was able to repeat key elements of the plan. All questions were answered  to their satisfaction.   I, [name], MD, have reviewed all documentation for this visit. The documentation on 05/26/22 for the exam, diagnosis, procedures, and orders are all accurate and complete.   THE PATIENT IS ENCOURAGED TO PRACTICE SOCIAL DISTANCING DUE TO THE COVID-19 PANDEMIC.

## 2022-05-27 LAB — LIPID PANEL
Chol/HDL Ratio: 2.7 ratio (ref 0.0–4.4)
Cholesterol, Total: 156 mg/dL (ref 100–199)
HDL: 57 mg/dL (ref >39)
LDL Chol Calc (NIH): 86 mg/dL (ref 0–99)
Triglycerides: 65 mg/dL (ref 0–149)
VLDL Cholesterol Cal: 13 mg/dL (ref 5–40)

## 2022-05-27 LAB — CMP14+EGFR
ALT: 14 IU/L (ref 0–32)
AST: 20 IU/L (ref 0–40)
Albumin/Globulin Ratio: 1.8 (ref 1.2–2.2)
Albumin: 4.1 g/dL (ref 3.9–4.9)
Alkaline Phosphatase: 103 IU/L (ref 44–121)
BUN/Creatinine Ratio: 10 (ref 9–23)
BUN: 8 mg/dL (ref 6–24)
Bilirubin Total: 0.3 mg/dL (ref 0.0–1.2)
CO2: 23 mmol/L (ref 20–29)
Calcium: 9.2 mg/dL (ref 8.7–10.2)
Chloride: 104 mmol/L (ref 96–106)
Creatinine, Ser: 0.84 mg/dL (ref 0.57–1.00)
Globulin, Total: 2.3 g/dL (ref 1.5–4.5)
Glucose: 114 mg/dL — ABNORMAL HIGH (ref 70–99)
Potassium: 4.5 mmol/L (ref 3.5–5.2)
Sodium: 139 mmol/L (ref 134–144)
Total Protein: 6.4 g/dL (ref 6.0–8.5)
eGFR: 86 mL/min/{1.73_m2} (ref >59)

## 2022-05-27 LAB — HEMOGLOBIN A1C
Est. average glucose Bld gHb Est-mCnc: 128 mg/dL
Hgb A1c MFr Bld: 6.1 % — ABNORMAL HIGH (ref 4.8–5.6)

## 2022-05-27 LAB — TSH: TSH: 1.11 u[IU]/mL (ref 0.450–4.500)

## 2022-05-27 LAB — CBC
Hematocrit: 38.3 % (ref 34.0–46.6)
Hemoglobin: 12.8 g/dL (ref 11.1–15.9)
MCH: 31.6 pg (ref 26.6–33.0)
MCHC: 33.4 g/dL (ref 31.5–35.7)
MCV: 95 fL (ref 79–97)
Platelets: 347 10*3/uL (ref 150–450)
RBC: 4.05 x10E6/uL (ref 3.77–5.28)
RDW: 11.7 % (ref 11.7–15.4)
WBC: 6.2 10*3/uL (ref 3.4–10.8)

## 2022-06-09 ENCOUNTER — Other Ambulatory Visit: Payer: Self-pay

## 2022-06-09 DIAGNOSIS — F5102 Adjustment insomnia: Secondary | ICD-10-CM

## 2022-06-09 MED ORDER — ALBUTEROL SULFATE HFA 108 (90 BASE) MCG/ACT IN AERS: 1.0000 | INHALATION_SPRAY | Freq: Four times a day (QID) | RESPIRATORY_TRACT | 0 refills | Status: DC | PRN

## 2022-06-09 MED ORDER — BELSOMRA 10 MG PO TABS: 1.0000 | ORAL_TABLET | Freq: Every day | ORAL | 2 refills | Status: DC

## 2022-06-13 ENCOUNTER — Other Ambulatory Visit: Payer: Self-pay | Admitting: Internal Medicine

## 2022-06-13 DIAGNOSIS — F5102 Adjustment insomnia: Secondary | ICD-10-CM

## 2022-06-13 MED ORDER — BELSOMRA 10 MG PO TABS: 1.0000 | ORAL_TABLET | Freq: Every day | ORAL | 2 refills | Status: AC

## 2022-06-18 ENCOUNTER — Telehealth: Payer: Self-pay

## 2022-06-18 NOTE — Telephone Encounter (Signed)
Completed PA for Belsomra

## 2022-06-22 ENCOUNTER — Other Ambulatory Visit: Payer: Self-pay | Admitting: Internal Medicine

## 2022-06-23 MED ORDER — ALBUTEROL SULFATE HFA 108 (90 BASE) MCG/ACT IN AERS: 1.0000 | INHALATION_SPRAY | Freq: Four times a day (QID) | RESPIRATORY_TRACT | 0 refills | Status: DC | PRN

## 2022-07-27 ENCOUNTER — Ambulatory Visit: Payer: BC Managed Care – PPO | Admitting: Internal Medicine

## 2022-08-06 ENCOUNTER — Ambulatory Visit
Admission: RE | Admit: 2022-08-06 | Discharge: 2022-08-06 | Disposition: A | Discharge status: Home / Self Care | Payer: BC Managed Care – PPO | Source: Ambulatory Visit | Attending: Internal Medicine | Admitting: Internal Medicine

## 2022-08-06 DIAGNOSIS — Z1231 Encounter for screening mammogram for malignant neoplasm of breast: Secondary | ICD-10-CM

## 2022-08-11 ENCOUNTER — Other Ambulatory Visit: Payer: Self-pay | Admitting: Internal Medicine

## 2022-08-11 DIAGNOSIS — R928 Other abnormal and inconclusive findings on diagnostic imaging of breast: Secondary | ICD-10-CM

## 2022-08-31 DIAGNOSIS — M1711 Unilateral primary osteoarthritis, right knee: Secondary | ICD-10-CM | POA: Diagnosis not present

## 2022-08-31 DIAGNOSIS — M170 Bilateral primary osteoarthritis of knee: Secondary | ICD-10-CM | POA: Diagnosis not present

## 2022-08-31 DIAGNOSIS — M1712 Unilateral primary osteoarthritis, left knee: Secondary | ICD-10-CM | POA: Diagnosis not present

## 2022-09-13 ENCOUNTER — Ambulatory Visit
Admission: RE | Admit: 2022-09-13 | Discharge: 2022-09-13 | Disposition: A | Discharge status: Home / Self Care | Payer: BC Managed Care – PPO | Source: Ambulatory Visit | Attending: Internal Medicine | Admitting: Internal Medicine

## 2022-09-13 DIAGNOSIS — R928 Other abnormal and inconclusive findings on diagnostic imaging of breast: Secondary | ICD-10-CM

## 2022-09-13 DIAGNOSIS — N6011 Diffuse cystic mastopathy of right breast: Secondary | ICD-10-CM | POA: Diagnosis not present

## 2022-09-13 DIAGNOSIS — N6012 Diffuse cystic mastopathy of left breast: Secondary | ICD-10-CM | POA: Diagnosis not present

## 2022-09-13 DIAGNOSIS — R922 Inconclusive mammogram: Secondary | ICD-10-CM | POA: Diagnosis not present

## 2023-05-08 IMAGING — DX DG CHEST 2V
2 series · 2 of 2 positions shown · non-contrast
Comparison: 10/10/2018

CLINICAL DATA: Cough, shortness of breath

EXAM:
CHEST - 2 VIEW

[chest pa]
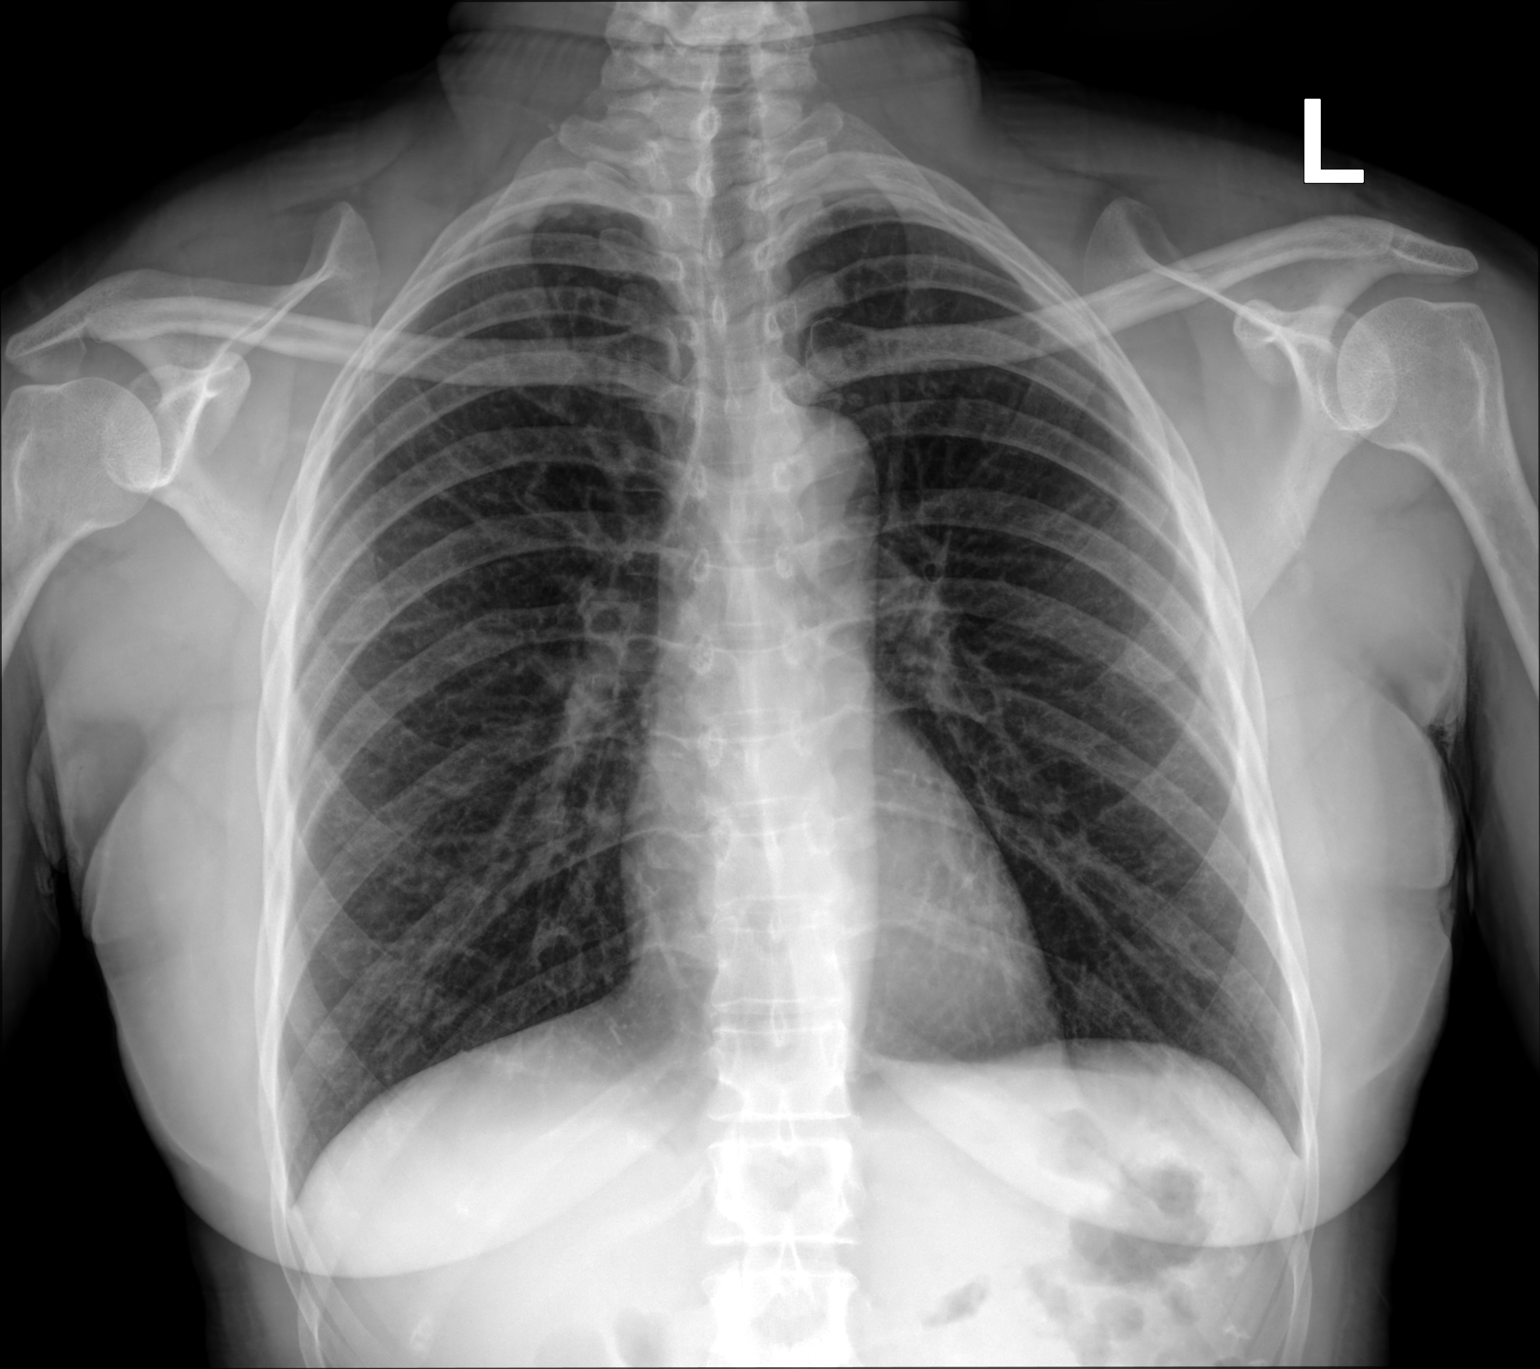

[chest lat]
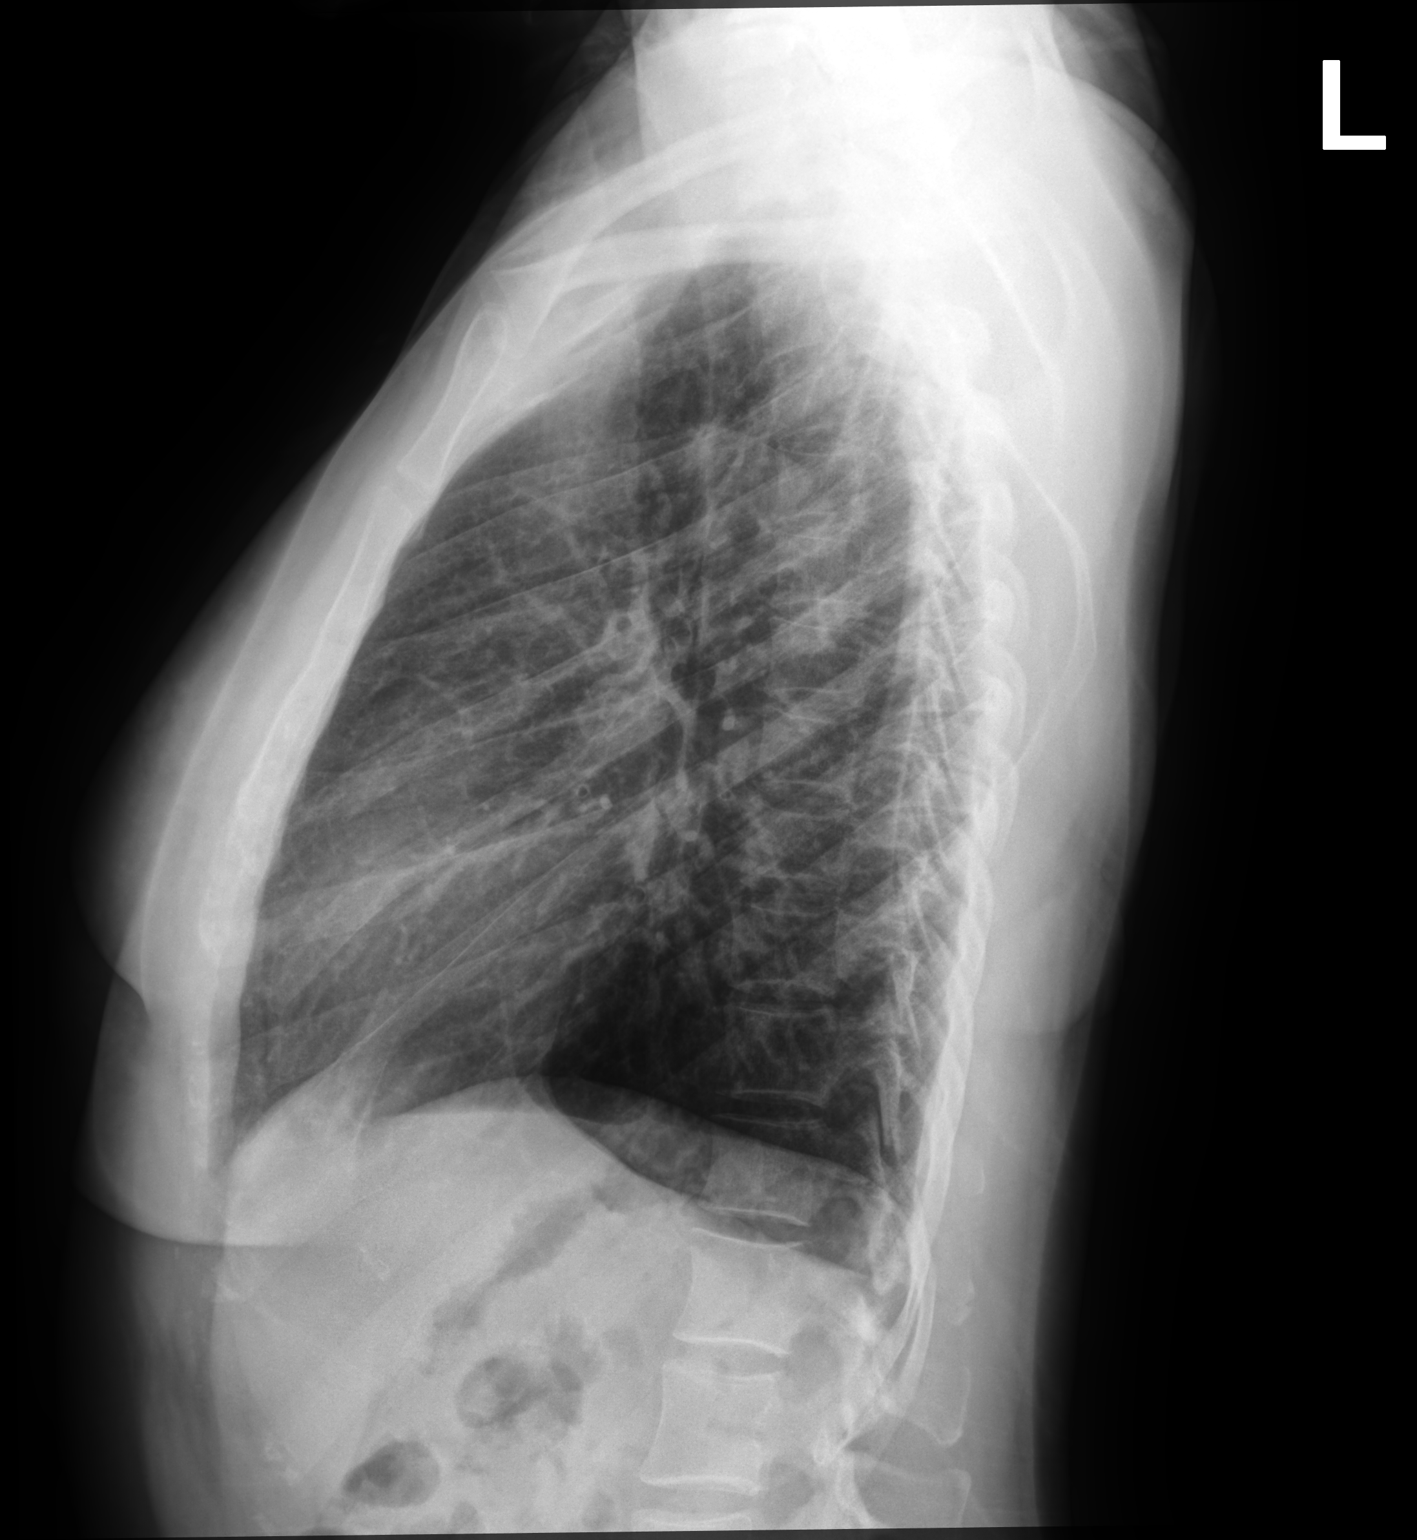

[2 of 2 positions shown; findings below may reference images not displayed]

FINDINGS: The heart size and mediastinal contours are within normal limits.
Minimal diffuse interstitial opacity. The visualized skeletal
structures are unremarkable.
IMPRESSION: Minimal diffuse interstitial opacity, suggesting atypical or viral
infection. No focal airspace opacity.

## 2023-06-09 ENCOUNTER — Encounter: Payer: Self-pay | Admitting: Internal Medicine

## 2023-06-09 ENCOUNTER — Ambulatory Visit (INDEPENDENT_AMBULATORY_CARE_PROVIDER_SITE_OTHER): Payer: BC Managed Care – PPO | Admitting: Internal Medicine

## 2023-06-09 VITALS — BP 126/82 | HR 94 | Temp 98.6°F | Ht 60.0 in | Wt 122.2 lb

## 2023-06-09 DIAGNOSIS — Z113 Encounter for screening for infections with a predominantly sexual mode of transmission: Secondary | ICD-10-CM | POA: Diagnosis not present

## 2023-06-09 DIAGNOSIS — Z91018 Allergy to other foods: Secondary | ICD-10-CM

## 2023-06-09 DIAGNOSIS — F4321 Adjustment disorder with depressed mood: Secondary | ICD-10-CM | POA: Diagnosis not present

## 2023-06-09 DIAGNOSIS — Z0000 Encounter for general adult medical examination without abnormal findings: Secondary | ICD-10-CM | POA: Diagnosis not present

## 2023-06-09 DIAGNOSIS — Z23 Encounter for immunization: Secondary | ICD-10-CM

## 2023-06-09 NOTE — Patient Instructions (Signed)

## 2023-06-09 NOTE — Progress Notes (Signed)
I,[name], CMA,acting as a Neurosurgeon for [name], MD.,have documented all relevant documentation on the behalf of [name], MD,as directed by  [name], MD while in the presence of [name], MD.  Subjective:    Patient ID: [name] , female    DOB: 01-09-1976 , 47 y.o.   MRN: [id number]  Chief Complaint  Patient presents with   Annual Exam    HPI  Patient presents today for her annual exam. Her last pap smear was in 2021.  She denies having any headaches, chest pain and shortness of breath. She states she has been relieved of her position and her job ends in December. 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   She would like to be checked for STDs today. She is not experiencing any symptoms. She is in a new relationship.      Past Medical History:  Diagnosis Date   COVID-19      Family History  Problem Relation Age of Onset   Diabetes Father    Diabetes Brother      Current Outpatient Medications:    albuterol (VENTOLIN HFA) 108 (90 Base) MCG/ACT inhaler, Inhale 1-2 puffs into the lungs every 6 (six) hours as needed for wheezing or shortness of breath., Disp: 1 each, Rfl: 0   Suvorexant (BELSOMRA) 10 MG TABS, Take 1 tablet by mouth at bedtime., Disp: 30 tablet, Rfl: 2   Allergies  Allergen Reactions   Shellfish Allergy Anaphylaxis, Hives and Itching   Iodine Itching    hives   Percocet [Oxycodone-Acetaminophen] Itching and Rash      The patient states she uses none for birth control. Patient's last menstrual period was 05/23/2023 (exact date).. Negative for Dysmenorrhea. Negative for: breast discharge, breast lump(s), breast pain and breast self exam. Associated symptoms include abnormal vaginal bleeding. Pertinent negatives include abnormal bleeding (hematology), anxiety, decreased libido, depression, difficulty falling sleep, dyspareunia, history of infertility,  nocturia, sexual dysfunction, sleep disturbances, urinary incontinence, urinary urgency, vaginal discharge and vaginal itching. Diet regular.The patient states her exercise level is  intermittent.  . The patient's tobacco use is:  Social History   Tobacco Use  Smoking Status Former   Types: Cigarettes  Smokeless Tobacco Never  . She has been exposed to passive smoke. The patient's alcohol use is:  Social History   Substance and Sexual Activity  Alcohol Use Yes   Comment: sometimes    Review of Systems  Constitutional: Negative.   HENT: Negative.    Eyes: Negative.   Respiratory: Negative.    Cardiovascular: Negative.   Gastrointestinal: Negative.   Endocrine: Negative.   Genitourinary: Negative.   Musculoskeletal: Negative.   Skin: Negative.   Allergic/Immunologic: Positive for food allergies.       She was advised by her Mom that she is allergic to shrimp.  She has not tried to eat it. She wants to have further testing to determine if she is truly allergic.   Neurological: Negative.   Hematological: Negative.   Psychiatric/Behavioral:  Positive for dysphoric mood.      Today's Vitals   06/09/23 0948  BP: 126/82  Pulse: 94  Temp: 98.6 F (37 C)  SpO2: 96%  Weight: 122 lb 3.2 oz (55.4 kg)  Height: 5' (1.524 m)   Body mass index is 23.87 kg/m.  Wt Readings from Last 3 Encounters:  06/09/23 122 lb 3.2 oz (55.4 kg)  05/26/22 130  lb (59 kg)  09/06/21 125 lb (56.7 kg)     Objective:  Physical Exam Vitals and nursing note reviewed. Exam conducted with a chaperone present.  Constitutional:      Appearance: Normal appearance.  HENT:     Head: Normocephalic and atraumatic.     Right Ear: Tympanic membrane, ear canal and external ear normal.     Left Ear: Tympanic membrane, ear canal and external ear normal.     Nose: Nose normal.     Mouth/Throat:     Mouth: Mucous membranes are moist.     Pharynx: Oropharynx is clear.  Eyes:     Extraocular Movements:  Extraocular movements intact.     Conjunctiva/sclera: Conjunctivae normal.     Pupils: Pupils are equal, round, and reactive to light.  Cardiovascular:     Rate and Rhythm: Normal rate and regular rhythm.     Pulses: Normal pulses.     Heart sounds: Normal heart sounds.  Pulmonary:     Effort: Pulmonary effort is normal.     Breath sounds: Normal breath sounds.  Chest:  Breasts:    Tanner Score is 5.     Right: Normal.     Left: Normal.  Abdominal:     General: Abdomen is flat. Bowel sounds are normal.     Palpations: Abdomen is soft.     Hernia: There is no hernia in the left inguinal area or right inguinal area.  Genitourinary:    Exam position: Supine.     Pubic Area: No rash or pubic lice.      Tanner stage (genital): 5.     Labia:        Right: No tenderness.        Left: No tenderness.      Vagina: Normal.     Cervix: Normal.     Uterus: Normal.      Adnexa: Right adnexa normal.     Comments: deferred Musculoskeletal:        General: Normal range of motion.     Cervical back: Normal range of motion and neck supple.  Lymphadenopathy:     Lower Body: No right inguinal adenopathy. No left inguinal adenopathy.  Skin:    General: Skin is warm and dry.  Neurological:     General: No focal deficit present.     Mental Status: She is alert and oriented to person, place, and time.  Psychiatric:        Mood and Affect: Mood normal.        Behavior: Behavior normal.         Assessment And Plan:     Encounter for general adult medical examination w/o abnormal findings Assessment & Plan: A full exam was performed.  Importance of monthly self breast exams was discussed with the patient.  She is advised to get 30-45 minutes of regular exercise, no less than four to five days per week. Both weight-bearing and aerobic exercises are recommended.  She is advised to follow a healthy diet with at least six fruits/veggies per day, decrease intake of red meat and other saturated  fats and to increase fish intake to twice weekly.  Meats/fish should not be fried -- baked, boiled or broiled is preferable. It is also important to cut back on your sugar intake.  Be sure to read labels - try to avoid anything with added sugar, high fructose corn syrup or other sweeteners.  If you must use a sweetener, you can  try stevia or monkfruit.  It is also important to avoid artificially sweetened foods/beverages and diet drinks. Lastly, wear SPF 50 sunscreen on exposed skin and when in direct sunlight for an extended period of time.  Be sure to avoid fast food restaurants and aim for at least 60 ounces of water daily.      Orders: -     CBC -     CMP14+EGFR -     Lipid panel -     Hemoglobin A1c -     TSH  Adjustment disorder with depressed mood Assessment & Plan: She states she was laid off from her job. Her last day is in December 2024.  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 She will let me know if her sx progress.    Screening for STD (sexually transmitted disease) -     HIV Antibody (routine testing w rflx) -     RPR -     Hepatitis C antibody -     NuSwab Vaginitis Plus (VG+) -     Hepatitis B surface antigen  Immunization due -     Flu vaccine trivalent PF, 6mos and older(Flulaval,Afluria,Fluarix,Fluzone)  History of food allergy Assessment & Plan: She states her Mom told her she was allergic to shrimp. She wants to be tested for confirmation. Agrees to Allergy referral.   Orders: -     Ambulatory referral to Allergy     Return for 1 year HM . Patient was given opportunity to ask questions. Patient verbalized understanding of the plan and was able to repeat key elements of the plan. All questions were answered to their satisfaction.    I, [name], MD, have reviewed all documentation for this visit. The documentation on 06/09/23 for the exam, diagnosis, procedures, and orders are all accurate and complete.

## 2023-06-09 NOTE — Assessment & Plan Note (Signed)

## 2023-06-09 NOTE — Assessment & Plan Note (Signed)
She states she was laid off from her job. Her last day is in December 2024.  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 She will let me know if her sx progress.

## 2023-06-09 NOTE — Assessment & Plan Note (Signed)
She states her Mom told her she was allergic to shrimp. She wants to be tested for confirmation. Agrees to Allergy referral.

## 2023-06-10 LAB — CMP14+EGFR
ALT: 19 [IU]/L (ref 0–32)
AST: 20 [IU]/L (ref 0–40)
Albumin: 4.1 g/dL (ref 3.9–4.9)
Alkaline Phosphatase: 101 [IU]/L (ref 44–121)
BUN/Creatinine Ratio: 10 (ref 9–23)
BUN: 8 mg/dL (ref 6–24)
Bilirubin Total: 0.4 mg/dL (ref 0.0–1.2)
CO2: 22 mmol/L (ref 20–29)
Calcium: 9.2 mg/dL (ref 8.7–10.2)
Chloride: 104 mmol/L (ref 96–106)
Creatinine, Ser: 0.77 mg/dL (ref 0.57–1.00)
Globulin, Total: 2.4 g/dL (ref 1.5–4.5)
Glucose: 107 mg/dL — ABNORMAL HIGH (ref 70–99)
Potassium: 4.2 mmol/L (ref 3.5–5.2)
Sodium: 138 mmol/L (ref 134–144)
Total Protein: 6.5 g/dL (ref 6.0–8.5)
eGFR: 95 mL/min/{1.73_m2} (ref >59)

## 2023-06-10 LAB — CBC
Hematocrit: 40.3 % (ref 34.0–46.6)
Hemoglobin: 13.2 g/dL (ref 11.1–15.9)
MCH: 32.1 pg (ref 26.6–33.0)
MCHC: 32.8 g/dL (ref 31.5–35.7)
MCV: 98 fL — ABNORMAL HIGH (ref 79–97)
Platelets: 288 10*3/uL (ref 150–450)
RBC: 4.11 x10E6/uL (ref 3.77–5.28)
RDW: 11.5 % — ABNORMAL LOW (ref 11.7–15.4)
WBC: 6.4 10*3/uL (ref 3.4–10.8)

## 2023-06-10 LAB — HIV ANTIBODY (ROUTINE TESTING W REFLEX): HIV Screen 4th Generation wRfx: NONREACTIVE

## 2023-06-10 LAB — LIPID PANEL
Chol/HDL Ratio: 2.7 ratio (ref 0.0–4.4)
Cholesterol, Total: 169 mg/dL (ref 100–199)
HDL: 62 mg/dL (ref >39)
LDL Chol Calc (NIH): 94 mg/dL (ref 0–99)
Triglycerides: 67 mg/dL (ref 0–149)
VLDL Cholesterol Cal: 13 mg/dL (ref 5–40)

## 2023-06-10 LAB — HEMOGLOBIN A1C
Est. average glucose Bld gHb Est-mCnc: 117 mg/dL
Hgb A1c MFr Bld: 5.7 % — ABNORMAL HIGH (ref 4.8–5.6)

## 2023-06-10 LAB — TSH: TSH: 2.22 u[IU]/mL (ref 0.450–4.500)

## 2023-06-10 LAB — RPR: RPR Ser Ql: NONREACTIVE

## 2023-06-10 LAB — HEPATITIS B SURFACE ANTIGEN: Hepatitis B Surface Ag: NEGATIVE

## 2023-06-10 LAB — HEPATITIS C ANTIBODY: Hep C Virus Ab: NONREACTIVE

## 2023-06-12 ENCOUNTER — Other Ambulatory Visit: Payer: Self-pay | Admitting: Internal Medicine

## 2023-06-12 ENCOUNTER — Encounter: Payer: Self-pay | Admitting: Internal Medicine

## 2023-06-12 LAB — NUSWAB VAGINITIS PLUS (VG+)
Atopobium vaginae: HIGH {score} — AB
BVAB 2: HIGH {score} — AB
Candida albicans, NAA: NEGATIVE
Candida glabrata, NAA: NEGATIVE
Chlamydia trachomatis, NAA: NEGATIVE
Megasphaera 1: HIGH {score} — AB
Neisseria gonorrhoeae, NAA: NEGATIVE
Trich vag by NAA: NEGATIVE

## 2023-06-14 MED ORDER — ALBUTEROL SULFATE HFA 108 (90 BASE) MCG/ACT IN AERS: 1.0000 | INHALATION_SPRAY | Freq: Four times a day (QID) | RESPIRATORY_TRACT | 0 refills | Status: DC | PRN

## 2023-06-16 ENCOUNTER — Encounter: Payer: Self-pay | Admitting: Allergy

## 2023-06-16 ENCOUNTER — Other Ambulatory Visit: Payer: Self-pay | Admitting: Internal Medicine

## 2023-06-16 ENCOUNTER — Ambulatory Visit (INDEPENDENT_AMBULATORY_CARE_PROVIDER_SITE_OTHER): Payer: BC Managed Care – PPO | Admitting: Allergy

## 2023-06-16 ENCOUNTER — Other Ambulatory Visit: Payer: Self-pay

## 2023-06-16 VITALS — BP 130/72 | HR 93 | Temp 98.2°F | Resp 16 | Ht 60.5 in | Wt 118.2 lb

## 2023-06-16 DIAGNOSIS — J4520 Mild intermittent asthma, uncomplicated: Secondary | ICD-10-CM | POA: Diagnosis not present

## 2023-06-16 DIAGNOSIS — Z91018 Allergy to other foods: Secondary | ICD-10-CM

## 2023-06-16 DIAGNOSIS — J310 Chronic rhinitis: Secondary | ICD-10-CM

## 2023-06-16 DIAGNOSIS — H109 Unspecified conjunctivitis: Secondary | ICD-10-CM

## 2023-06-16 DIAGNOSIS — T781XXA Other adverse food reactions, not elsewhere classified, initial encounter: Secondary | ICD-10-CM | POA: Diagnosis not present

## 2023-06-16 DIAGNOSIS — H1013 Acute atopic conjunctivitis, bilateral: Secondary | ICD-10-CM

## 2023-06-16 MED ORDER — TINIDAZOLE 500 MG PO TABS: ORAL_TABLET | ORAL | 0 refills | Status: DC

## 2023-06-16 MED ORDER — MONTELUKAST SODIUM 10 MG PO TABS: 10.0000 mg | ORAL_TABLET | Freq: Every day | ORAL | 5 refills | Status: DC

## 2023-06-16 NOTE — Patient Instructions (Addendum)
Food allergy - ?shellfish Unclear history of shellfish allergy, patient has avoided shellfish due to maternal advice but has consumed it occasionally with preemptive Benadryl use. No clear allergic reaction reported. -Perform skin testing for shellfish allergy on next visit. -Draw blood for shellfish allergy testing today. -If both blood and skin testing is negative or low/small then will recommend in-office food challenge to shellfish (can use shrimp) to prove you are not allergic and safe to eat  Oral Allergy Syndrome Reports of mouth itching with consumption of certain fresh fruits (apples, pears, kiwi), but not when cooked. Likely cross-reactivity with pollen allergy. -Continue avoidance of raw fruits that cause symptoms. -Will evaluate next visit with environmental allergy testing -Will obtain kiwi and pear bloodwork since we do not have it for skin testing  Reactive Airway Disease Post-COVID Developed respiratory symptoms post-COVID infection in July 2020, requiring use of albuterol inhaler and occasional prednisone during illness. Lung function test today was normal. -Continue use of albuterol inhaler as needed. -Recommend starting Singulair for dual allergy and respiratory symptom control. Singulair (montelukast) 10mg  daily.  This is an antileukotriene that can help with both allergy and asthma symptom control.  If you notice any change in mood/behavior/sleep after starting Singulair then stop this medication and let us know.  Symptoms resolve after stopping the medication.   Allergic Rhinitis Reports of seasonal allergy symptoms, currently managed with Zyrtec. -Continue Zyrtec as needed. -Start Singulair as above  Follow-up 06/23/23 at 9am for skin testing hold all antihistamines for 3 days prior.  Continue Singulair as this is not an antihistamine.

## 2023-06-16 NOTE — Progress Notes (Signed)
New Patient Note  RE: [name] MRN: [id number] DOB: March 30, 1976 Date of Office Visit: 06/16/2023  Primary care provider: Dorothyann Peng, MD  Chief Complaint: food allergy  History of present illness: [name] is a 47 y.o. female presenting today for evaluation of food allergy.    Discussed the use of AI scribe software for clinical note transcription with the patient, who gave verbal consent to proceed.  She reports a potential allergy to shellfish, which has been avoided throughout her life due to maternal advice. On the rare occasions when shellfish is consumed, the patient preemptively takes Benadryl due to fear of a potential allergic reaction. However, she is unsure if she truly has an allergic reaction or if it is psychosomatic.  The patient also reports an allergic reaction to several fruits, including apples, pears, and kiwi, which cause itching in her mouth. Interestingly, she notes that she can consume cooked apples without any issue.  In addition to food allergies, the patient developed reactive airway disease following a COVID-19 infection in July 2020. Since then, she experiences breathing difficulties (coughing, wheezing, shortness of breath) whenever she is sick with URI, necessitating the use of a albuterol rescue inhaler with does help with relief of symptoms.  She does call with the illness she had this year she had to go to her PCPs office and did get a breathing treatment in office.  She has also been prescribed prednisone during these episodes.  The patient also reports seasonal allergies, particularly during the fall. Symptoms include a runny, stuffy nose, sneezing, and itchy, watery eyes. These symptoms are managed with Zyrtec, which the patient reports as effective. She has also used eye drops for eye irritation, which has occurred two to three times recently due to environmental factors playing rain or wind exposures.  The patient has a lingering cough,  which she attributes to allergies. She has been using Mucinex DM to manage this symptom.      Review of systems: 10pt ROS negative unless noted above in HPI  All other systems negative unless noted above in HPI  Past medical history: Past Medical History:  Diagnosis Date   Asthma    COVID-19    Eczema     Past surgical history: Past Surgical History:  Procedure Laterality Date   CESAREAN SECTION  2001   TENDON REPAIR  2010    Family history:  Family History  Problem Relation Age of Onset   Allergic rhinitis Mother    Eczema Mother    Allergic rhinitis Father    Diabetes Father    Eczema Sister    Allergic rhinitis Sister    Diabetes Brother     Social history: Lives in a home with carpeting with gas heating and heat pump cooling.  Dogs in the home.  There is no concern for water damage, mildew or roaches in the home.  She does not report for an occupation on her environmental history form.  She denies a smoking history.   Medication List: Current Outpatient Medications  Medication Sig Dispense Refill   montelukast (SINGULAIR) 10 MG tablet Take 1 tablet (10 mg total) by mouth at bedtime. 30 tablet 5   albuterol (VENTOLIN HFA) 108 (90 Base) MCG/ACT inhaler Inhale 1-2 puffs into the lungs every 6 (six) hours as needed for wheezing or shortness of breath. 1 each 0   Suvorexant (BELSOMRA) 10 MG TABS Take 1 tablet by mouth at bedtime. 30 tablet 2   No current  facility-administered medications for this visit.    Known medication allergies: Allergies  Allergen Reactions   Shellfish Allergy Anaphylaxis, Hives and Itching   Iodine Itching    hives   Percocet [Oxycodone-Acetaminophen] Itching and Rash     Physical examination: Blood pressure 130/72, pulse 93, temperature 98.2 F (36.8 C), resp. rate 16, height 5' 0.5" (1.537 m), weight 118 lb 3.2 oz (53.6 kg), last menstrual period 05/23/2023, SpO2 97%.  General: Alert, interactive, in no acute distress. HEENT:  PERRLA, TMs pearly gray, turbinates minimally edematous without discharge, post-pharynx non erythematous. Neck: Supple without lymphadenopathy. Lungs: Clear to auscultation without wheezing, rhonchi or rales. {no increased work of breathing. CV: Normal S1, S2 without murmurs. Abdomen: Nondistended, nontender. Skin: Warm and dry, without lesions or rashes. Extremities:  No clubbing, cyanosis or edema. Neuro:   Grossly intact.  Diagnositics/Labs:  Spirometry: FEV1: 2.13 L 100%, FVC: 2.61 L 100%, ratio consistent with nonobstructive pattern  Assessment and plan: Food allergy - ?shellfish Unclear history of shellfish allergy, patient has avoided shellfish due to maternal advice but has consumed it occasionally with preemptive Benadryl use. No clear allergic reaction reported. -Perform skin testing for shellfish allergy on next visit. -Draw blood for shellfish allergy testing today. -If both blood and skin testing is negative or low/small then will recommend in-office food challenge to shellfish (can use shrimp) to prove you are not allergic and safe to eat -If testing returns positive then will prescribe an epinephrine device  Oral Allergy Syndrome Reports of mouth itching with consumption of certain fresh fruits (apples, pears, kiwi), but not when cooked. Likely cross-reactivity with pollen allergy. -Continue avoidance of raw fruits that cause symptoms. -Will evaluate next visit with environmental allergy testing -Will obtain kiwi and pear bloodwork since we do not have it for skin testing  Reactive Airway Disease Post-COVID Developed respiratory symptoms post-COVID infection in July 2020, requiring use of albuterol inhaler and occasional prednisone during illness. Lung function test today was normal. -Continue use of albuterol inhaler as needed. -Recommend starting Singulair for dual allergy and respiratory symptom control. Singulair (montelukast) 10mg  daily.  This is an antileukotriene  that can help with both allergy and asthma symptom control.  If you notice any change in mood/behavior/sleep after starting Singulair then stop this medication and let us know.  Symptoms resolve after stopping the medication.   Allergic Rhinitis Reports of seasonal allergy symptoms, currently managed with Zyrtec. -Continue Zyrtec as needed. -Start Singulair as above  Follow-up 06/23/23 at 9am for skin testing (1-55 env/shellfish panel, apple)  I appreciate the opportunity to take part in Farheen's care. Please do not hesitate to contact me with questions.  Sincerely,   Margo Aye, MD Allergy/Immunology [facility]

## 2023-06-20 LAB — ALLERGEN PROFILE, SHELLFISH
Clam IgE: <0.1 kU/L
F023-IgE Crab: <0.1 kU/L
F080-IgE Lobster: <0.1 kU/L
F290-IgE Oyster: <0.1 kU/L
Scallop IgE: <0.1 kU/L
Shrimp IgE: <0.1 kU/L

## 2023-06-20 LAB — F094-IGE PEAR: Allergen Pear IgE: <0.1 kU/L

## 2023-06-20 LAB — ALLERGEN, KIWI FRUIT, F84: Kiwi Fruit: <0.1 kU/L

## 2023-06-23 ENCOUNTER — Telehealth: Payer: Self-pay | Admitting: Allergy

## 2023-06-23 ENCOUNTER — Encounter: Payer: Self-pay | Admitting: Allergy

## 2023-06-23 ENCOUNTER — Ambulatory Visit (INDEPENDENT_AMBULATORY_CARE_PROVIDER_SITE_OTHER): Payer: BC Managed Care – PPO | Admitting: Allergy

## 2023-06-23 DIAGNOSIS — Z91018 Allergy to other foods: Secondary | ICD-10-CM | POA: Diagnosis not present

## 2023-06-23 DIAGNOSIS — J3089 Other allergic rhinitis: Secondary | ICD-10-CM | POA: Diagnosis not present

## 2023-06-23 DIAGNOSIS — T781XXD Other adverse food reactions, not elsewhere classified, subsequent encounter: Secondary | ICD-10-CM

## 2023-06-23 DIAGNOSIS — J302 Other seasonal allergic rhinitis: Secondary | ICD-10-CM

## 2023-06-23 DIAGNOSIS — H1013 Acute atopic conjunctivitis, bilateral: Secondary | ICD-10-CM

## 2023-06-23 MED ORDER — MONTELUKAST SODIUM 10 MG PO TABS: 10.0000 mg | ORAL_TABLET | Freq: Every day | ORAL | 5 refills | Status: AC

## 2023-06-23 MED ORDER — CETIRIZINE HCL 5 MG PO TABS: 5.0000 mg | ORAL_TABLET | Freq: Every day | ORAL | 5 refills | Status: AC

## 2023-06-23 MED ORDER — ALBUTEROL SULFATE HFA 108 (90 BASE) MCG/ACT IN AERS: 1.0000 | INHALATION_SPRAY | Freq: Four times a day (QID) | RESPIRATORY_TRACT | 0 refills | Status: AC | PRN

## 2023-06-23 NOTE — Telephone Encounter (Signed)
Food Protocol Form has been mailed to patient.

## 2023-06-23 NOTE — Telephone Encounter (Signed)
Patient has a shrimp challenge scheduled with Thurston Hole at 1:30pm on November 19th. Please mail out challenge instructions to patient.  Best Contact:[phone]

## 2023-06-23 NOTE — Patient Instructions (Addendum)
Food allergy  Unclear history of shellfish allergy, patient has avoided shellfish due to maternal advice but has consumed it occasionally with preemptive Benadryl use. No clear allergic reaction reported. -Skin testing to shellfish slightly positive to shrimp and negative to crab, lobster, oyster, scallop -Serum IgE to shellfish, kiwi and pear were negative via blood work -Recommend an in-office food challenge to shrimp to determine if you are not allergic to shellfish.  Bring about 8-10 cooked regular/large shrimp (no fried shrimp, can season with salt/pepper).  Plan to be in office for at least 2 hours for challenge.  Hold antihistamines for 3 days prior to challenge visit.    Oral Allergy Syndrome -The oral allergy syndrome (OAS) or pollen-food allergy syndrome (PFAS) is a relatively common form of food allergy, particularly in adults. It typically occurs in people who have pollen allergies when the immune system "sees" proteins on the food that look like proteins on the pollen. This results in the allergy antibody (IgE) binding to the food instead of the pollen. Patients typically report itching and/or mild swelling of the mouth and throat immediately following ingestion of certain uncooked fruits (including nuts) or raw vegetables. Only a very small number of affected individuals experience systemic allergic reactions, such as anaphylaxis which occurs with true food allergies.      -Continue avoidance of raw fruits that cause symptoms. -Environmental allergy testing is positive to grasses, weeds, trees, molds, cat, dog and horse.  Allergen avoidance measures provided.  Allergic Rhinitis Reports of seasonal allergy symptoms, currently managed with Zyrtec. -Environmental allergy testing as above.  -Continue Zyrtec as needed. -Continue Singulair 10 mg daily for dual allergy and respiratory symptom control.   Reactive Airway Disease Post-COVID Developed respiratory symptoms post-COVID infection  in July 2020, requiring use of albuterol inhaler and occasional prednisone during illness.  -Continue use of albuterol inhaler as needed. -Singular as above  Follow-up 6 months or sooner if needed

## 2023-06-23 NOTE — Progress Notes (Signed)
Follow-up Note  RE: [name] MRN: [id number] DOB: 02-04-1976 Date of Office Visit: 06/23/2023   History of present illness: [name] is a 47 y.o. female presenting today for skin testing.  She was last seen in the office on 06/16/2023 by myself.  She is in her usual state of health today.  She has held all antihistamines for at least the past 3 days for testing today.  Medication List: Current Outpatient Medications  Medication Sig Dispense Refill   cetirizine (ZYRTEC) 5 MG tablet Take 5 mg by mouth daily.     albuterol (VENTOLIN HFA) 108 (90 Base) MCG/ACT inhaler Inhale 1-2 puffs into the lungs every 6 (six) hours as needed for wheezing or shortness of breath. 1 each 0   montelukast (SINGULAIR) 10 MG tablet Take 1 tablet (10 mg total) by mouth at bedtime. 30 tablet 5   Suvorexant (BELSOMRA) 10 MG TABS Take 1 tablet by mouth at bedtime. 30 tablet 2   tinidazole (TINDAMAX) 500 MG tablet Take 2 tabs twice daily x 2 days 8 tablet 0   No current facility-administered medications for this visit.     Known medication allergies: Allergies  Allergen Reactions   Shellfish Allergy Anaphylaxis, Hives and Itching   Iodine Itching    hives   Percocet [Oxycodone-Acetaminophen] Itching and Rash     Physical examination:  General: Alert, interactive, in no acute distress. Skin: Warm and dry, without lesions or rashes. Extremities:  No clubbing, cyanosis or edema. Neuro:   Grossly intact.  Diagnositics/Labs: Labs:  Component     Latest Ref Rng 06/16/2023  F023-IgE Crab     Class 0 kU/L <0.10   Shrimp IgE     Class 0 kU/L <0.10   F080-IgE Lobster     Class 0 kU/L <0.10   Clam IgE     Class 0 kU/L <0.10   F290-IgE Oyster     Class 0 kU/L <0.10   Scallop IgE     Class 0 kU/L <0.10   Kiwi Fruit     Class 0 kU/L <0.10   Allergen Pear IgE     Class 0 kU/L <0.10    Allergy testing:   Airborne Adult Perc - 06/23/23 1000     Time Antigen Placed 7253    Allergen  Manufacturer Waynette Buttery    Location Back    Number of Test 55    1. Control-Buffer 50% Glycerol Negative    2. Control-Histamine 2+    3. Bahia 2+    4. [place] Negative    5. Johnson 2+    6. [place] Blue 3+    7. Meadow Fescue 4+    8. Perennial Rye 3+    9. Timothy 3+    10. Ragweed Mix Negative    11. Cocklebur Negative    12. Plantain,  English Negative    13. Baccharis Negative    14. Dog Fennel Negative    15. Russian Thistle Negative    16. Lamb's Quarters Negative    17. Sheep Sorrell Negative    18. Rough Pigweed Negative    19. Marsh Elder, Rough 2+    20. Mugwort, Common Negative    21. Box, Elder Negative    22. Cedar, red 2+    23. Sweet Gum Negative    24. Pecan Pollen Negative    25. Pine Mix Negative    26. Walnut, Black Pollen Negative    27. Red Mulberry 2+  28. Ash Mix Negative    29. Birch Mix Negative    30. Beech American Negative    31. Cottonwood, Guinea[place] Negative    32. Hickory, White Negative    33. Maple Mix Negative    34. Oak, [place] Mix Negative    35. Sycamore Eastern 2+    36. Alternaria Alternata Negative    37. Cladosporium Herbarum 2+    38. Aspergillus Mix Negative    39. Penicillium Mix Negative    40. Bipolaris Sorokiniana (Helminthosporium) Negative    41. Drechslera Spicifera (Curvularia) Negative    42. Mucor Plumbeus Negative    43. Fusarium Moniliforme 2+    44. Aureobasidium Pullulans (pullulara) 2+    45. Rhizopus Oryzae Negative    46. Botrytis Cinera Negative    47. Epicoccum Nigrum 2+    48. Phoma Betae 2+    49. Dust Mite Mix Negative    50. Cat Hair 10,000 BAU/ml 2+    51.  Dog Epithelia 2+    52. Mixed Feathers Negative    53. Horse Epithelia 2+    54. Cockroach, German Negative    55. Tobacco Leaf Negative             Food Adult Perc - 06/23/23 1000     23. Shrimp 2+   3x3   24. Crab Negative    25. Lobster Negative    26. Oyster Negative    27. Scallops Negative    58. Apple Negative              Allergy testing results were read and interpreted by provider, documented by clinical staff.   Assessment and plan:   Food allergy  Unclear history of shellfish allergy, patient has avoided shellfish due to maternal advice but has consumed it occasionally with preemptive Benadryl use. No clear allergic reaction reported. -Skin testing to shellfish slightly positive to shrimp and negative to crab, lobster, oyster, scallop -Serum IgE to shellfish, kiwi and pear were negative via blood work -Recommend an in-office food challenge to shrimp to determine if you are not allergic to shellfish.  Bring about 8-10 cooked regular/large shrimp (no fried shrimp, can season with salt/pepper).  Plan to be in office for at least 2 hours for challenge.  Hold antihistamines for 3 days prior to challenge visit.    Oral Allergy Syndrome -The oral allergy syndrome (OAS) or pollen-food allergy syndrome (PFAS) is a relatively common form of food allergy, particularly in adults. It typically occurs in people who have pollen allergies when the immune system "sees" proteins on the food that look like proteins on the pollen. This results in the allergy antibody (IgE) binding to the food instead of the pollen. Patients typically report itching and/or mild swelling of the mouth and throat immediately following ingestion of certain uncooked fruits (including nuts) or raw vegetables. Only a very small number of affected individuals experience systemic allergic reactions, such as anaphylaxis which occurs with true food allergies.   -Continue avoidance of raw fruits that cause symptoms. -Environmental allergy testing is positive to grasses, weeds, trees, molds, cat, dog and horse.  Allergen avoidance measures provided.  Allergic Rhinitis Reports of seasonal allergy symptoms, currently managed with Zyrtec. -Environmental allergy testing as above.  -Continue Zyrtec as needed. -Continue Singulair 10 mg daily for dual  allergy and respiratory symptom control.   Reactive Airway Disease Post-COVID Developed respiratory symptoms post-COVID infection in July 2020, requiring use of albuterol inhaler and occasional prednisone during  illness.  -Continue use of albuterol inhaler as needed. -Singular as above  Follow-up 6 months or sooner if needed   I appreciate the opportunity to take part in Mable's care. Please do not hesitate to contact me with questions.  Sincerely,   Margo Aye, MD Allergy/Immunology [facility]

## 2023-06-28 ENCOUNTER — Encounter: Payer: BC Managed Care – PPO | Admitting: Family Medicine

## 2023-06-28 DIAGNOSIS — J309 Allergic rhinitis, unspecified: Secondary | ICD-10-CM

## 2023-06-28 NOTE — Progress Notes (Deleted)
   [address] [place] [address] Dept: [phone]  FOLLOW UP NOTE  Patient ID: [name], female    DOB: 03-08-1976  Age: 47 y.o. MRN: [id number] Date of Office Visit: 06/28/2023  Assessment  Chief Complaint: No chief complaint on file.  HPI [name] is a 47 year old female who presents to the clinic for follow-up visit.  She was last seen in this clinic on 06/23/2023 by Dr. 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  Her last food allergy skin testing on 06/23/2023 was negative to the shellfish panel with the exception of shrimp at 3 x 3.  Lab work on 06/16/2023 was negative to the shrimp panel.  Discussed the use of AI scribe software for clinical note transcription with the patient, who gave verbal consent to proceed.  History of Present Illness             Drug Allergies:  Allergies  Allergen Reactions   Shellfish Allergy Anaphylaxis, Hives and Itching   Iodine Itching    hives   Percocet [Oxycodone-Acetaminophen] Itching and Rash    Physical Exam: LMP 05/23/2023 (Exact Date)    Physical Exam  Diagnostics:   Procedure note:  Written consent obtained  {Blank single:19197::"Open graded *** challenge","Open graded *** oral challenge"}: The patient was able to tolerate the challenge today without adverse signs or symptoms. Vital signs were stable throughout the challenge and observation period. She received multiple doses separated by {Blank single:19197::"30 minutes","20 minutes","15 minutes","10 minutes"}, each of which was separated by vitals and a brief physical exam. She received the following doses: lip rub, 1 gm, 2 gm, 4 gm, 8 gm, and 16 gm. She was monitored for 60 minutes following the last dose.  Total testing time:  The patient had {Blank single:19197::"***","negative skin prick test and sIgE tests to ***","negative sIgE tests to  ***","negative skin prick tests to ***"} and was able to tolerate the open graded oral challenge today without adverse signs or symptoms. Therefore, she has the same risk of systemic reaction associated with {Blank single:19197::"***","the consumption of ***"} as the general population.  Assessment and Plan: No diagnosis found.  No orders of the defined types were placed in this encounter.   There are no Patient Instructions on file for this visit.  No follow-ups on file.    Thank you for the opportunity to care for this patient.  Please do not hesitate to contact me with questions.  Thermon Leyland, FNP [facility]

## 2024-06-12 ENCOUNTER — Ambulatory Visit (INDEPENDENT_AMBULATORY_CARE_PROVIDER_SITE_OTHER): Payer: BC Managed Care – PPO | Admitting: Internal Medicine

## 2024-06-12 ENCOUNTER — Other Ambulatory Visit (HOSPITAL_COMMUNITY)
Admission: RE | Admit: 2024-06-12 | Discharge: 2024-06-12 | Disposition: A | Discharge status: Home / Self Care | Source: Ambulatory Visit | Attending: Internal Medicine | Admitting: Internal Medicine

## 2024-06-12 ENCOUNTER — Encounter: Payer: Self-pay | Admitting: Internal Medicine

## 2024-06-12 VITALS — BP 110/80 | HR 84 | Temp 98.3°F | Ht 60.0 in | Wt 125.0 lb

## 2024-06-12 DIAGNOSIS — M25561 Pain in right knee: Secondary | ICD-10-CM | POA: Diagnosis not present

## 2024-06-12 DIAGNOSIS — M79651 Pain in right thigh: Secondary | ICD-10-CM | POA: Diagnosis not present

## 2024-06-12 DIAGNOSIS — N760 Acute vaginitis: Secondary | ICD-10-CM

## 2024-06-12 DIAGNOSIS — Z23 Encounter for immunization: Secondary | ICD-10-CM | POA: Diagnosis not present

## 2024-06-12 DIAGNOSIS — M25562 Pain in left knee: Secondary | ICD-10-CM

## 2024-06-12 DIAGNOSIS — N951 Menopausal and female climacteric states: Secondary | ICD-10-CM

## 2024-06-12 DIAGNOSIS — Z0000 Encounter for general adult medical examination without abnormal findings: Secondary | ICD-10-CM | POA: Insufficient documentation

## 2024-06-12 DIAGNOSIS — G8929 Other chronic pain: Secondary | ICD-10-CM

## 2024-06-12 DIAGNOSIS — M79652 Pain in left thigh: Secondary | ICD-10-CM

## 2024-06-12 DIAGNOSIS — Z8249 Family history of ischemic heart disease and other diseases of the circulatory system: Secondary | ICD-10-CM

## 2024-06-12 LAB — POC HEMOCCULT BLD/STL (OFFICE/1-CARD/DIAGNOSTIC): Fecal Occult Blood, POC: NEGATIVE

## 2024-06-12 NOTE — Assessment & Plan Note (Addendum)
 A full exam was performed.  Importance of monthly self breast exams was discussed with the patient.  She is advised to get 30-45 minutes of regular exercise, no less than four to five days per week. Both weight-bearing and aerobic exercises are recommended.  She is advised to follow a healthy diet with at least six fruits/veggies per day, decrease intake of red meat and other saturated fats and to increase fish intake to twice weekly.  Meats/fish should not be fried -- baked, boiled or broiled is preferable. It is also important to cut back on your sugar intake.  Be sure to read labels - try to avoid anything with added sugar, high fructose corn syrup or other sweeteners.  If you must use a sweetener, you can try stevia or monkfruit.  It is also important to avoid artificially sweetened foods/beverages and diet drinks. Lastly, wear SPF 50 sunscreen on exposed skin and when in direct sunlight for an extended period of time.  Be sure to avoid fast food restaurants and aim for at least 60 ounces of water daily.    - pap smear performed - Stool is heme negative.

## 2024-06-12 NOTE — Patient Instructions (Signed)

## 2024-06-12 NOTE — Progress Notes (Signed)
 I,[name], CMA,acting as a neurosurgeon for Paula LOISE Slocumb, MD.,have documented all relevant documentation on the behalf of Paula LOISE Slocumb, MD,as directed by  Paula LOISE Slocumb, MD while in the presence of Paula LOISE Slocumb, MD.  Subjective:    Patient ID: [name] , female    DOB: 1975/11/30 , 48 y.o.   MRN: [id number]  Chief Complaint  Patient presents with   Annual Exam    Patient presents today for her annual exam. She denies having any headaches, chest pain and shortness of breath. She is not currently est with GYN.  She reports experiencing a yellow discharge. A slight smell that goes & comes. She states usually experiencing this before or after her cycle. She would like to do a swab.    HPI Discussed the use of AI scribe software for clinical note transcription with the patient, who gave verbal consent to proceed.  History of Present Illness PERNELLA Gould is a 48 year old female who presents with concerns of a possible bacterial infection and leg pain.  She experiences a yellowish discharge, particularly noticeable before and after her menstrual cycles. Her last menstrual cycle began last week, and she is not currently menstruating. She has not had a Pap smear in four years.  She describes leg pain that is especially bothersome at night, characterized as aching and sometimes shooting through her knees. Her legs hurt consistently, particularly at night. She previously consulted an orthopedic specialist for her knees but has not returned due to financial constraints after being out of work for a year. She recently started working full-time at [employer] in September. She reports increased popping and swelling in her knees. She has tried Voltaren in the past but avoided it due to a shellfish allergy .  She experiences symptoms of perimenopause, including mental fatigue, tiredness, cramps, and brain fog. Although she reports sleeping better, her boyfriend notes that she is not  sleeping well due to leg pain. She drinks about 32 ounces of water daily and has a family history of heart disease. She is not currently taking any over-the-counter supplements.  Her social history includes working full-time at [employer] and running a [employer]. She recently changed her work hours to 12 PM to 9 PM and is adjusting to the new schedule. She walks her dog for about ten minutes daily but has not been exercising much otherwise. 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  She reports regular bowel movements and follows a diet that includes meat, although she is not eating as regularly as she used to due to her busy schedule. She has a history of shellfish allergy  but can tolerate some crab meat. She has not had a mammogram recently and is unsure if she is up to date on her breast x-rays.   HPI   Past Medical History:  Diagnosis Date   Asthma    COVID-19    Eczema      Family History  Problem Relation Age of Onset   Allergic rhinitis Mother    Eczema Mother    Allergic rhinitis Father    Diabetes Father    Eczema Sister    Allergic rhinitis Sister    Diabetes Brother      Current Outpatient Medications:    albuterol  (VENTOLIN  HFA) 108 (90 Base) MCG/ACT inhaler, Inhale 1-2 puffs into the lungs every 6 (six)  hours as needed for wheezing or shortness of breath., Disp: 1 each, Rfl: 0   cetirizine  (ZYRTEC ) 5 MG tablet, Take 1 tablet (5 mg total) by mouth daily., Disp: 30 tablet, Rfl: 5   montelukast  (SINGULAIR ) 10 MG tablet, Take 1 tablet (10 mg total) by mouth at bedtime., Disp: 30 tablet, Rfl: 5   Suvorexant  (BELSOMRA ) 10 MG TABS, Take 1 tablet by mouth at bedtime. (Patient not taking: Reported on 06/12/2024), Disp: 30 tablet, Rfl: 2   tinidazole  (TINDAMAX ) 500 MG tablet, Take 2 tabs twice daily x 2 days (Patient not taking: Reported on 06/12/2024), Disp: 8 tablet, Rfl: 0    Allergies  Allergen Reactions   Shellfish Allergy  Anaphylaxis, Hives and Itching   Iodine Itching    hives   Percocet [Oxycodone-Acetaminophen] Itching and Rash      The patient states she uses none for birth control. Patient's last menstrual period was 06/05/2024 (exact date).. Negative for Dysmenorrhea. Negative for: breast discharge, breast lump(s), breast pain and breast self exam. Associated symptoms include abnormal vaginal bleeding. Pertinent negatives include abnormal bleeding (hematology), anxiety, decreased libido, depression, difficulty falling sleep, dyspareunia, history of infertility, nocturia, sexual dysfunction, sleep disturbances, urinary incontinence, urinary urgency, vaginal discharge and vaginal itching. Diet regular.The patient states her exercise level is intermittent, walks dog regularly.   . The patient's tobacco use is:  Social History   Tobacco Use  Smoking Status Former   Types: Cigarettes   Passive exposure: Current  Smokeless Tobacco Never  . She has been exposed to passive smoke. The patient's alcohol use is:  Social History   Substance and Sexual Activity  Alcohol Use Yes   Comment: sometimes    Review of Systems  Constitutional: Negative.   HENT: Negative.    Eyes: Negative.   Respiratory: Negative.    Cardiovascular: Negative.   Gastrointestinal: Negative.   Endocrine: Negative.   Genitourinary: Negative.   Musculoskeletal: Negative.   Skin: Negative.   Allergic/Immunologic: Negative.   Neurological: Negative.   Hematological: Negative.   Psychiatric/Behavioral: Negative.       Today's Vitals   06/12/24 1004  BP: 110/80  Pulse: 84  Temp: 98.3 F (36.8 C)  SpO2: 98%  Weight: 125 lb (56.7 kg)  Height: 5' (1.524 m)   Body mass index is 24.41 kg/m.  Wt Readings from Last 3 Encounters:  06/12/24 125 lb (56.7 kg)  06/16/23 118 lb 3.2 oz (53.6 kg)  06/09/23 122 lb 3.2 oz (55.4 kg)     Objective:  Physical Exam Vitals and  nursing note reviewed. Exam conducted with a chaperone present.  Constitutional:      Appearance: Normal appearance.  HENT:     Head: Normocephalic and atraumatic.     Right Ear: Tympanic membrane, ear canal and external ear normal.     Left Ear: Tympanic membrane, ear canal and external ear normal.     Nose: Nose normal.     Mouth/Throat:     Mouth: Mucous membranes are moist.     Pharynx: Oropharynx is clear.  Eyes:     Extraocular Movements: Extraocular movements intact.     Conjunctiva/sclera: Conjunctivae normal.     Pupils: Pupils are equal, round, and reactive to light.  Cardiovascular:     Rate and Rhythm: Normal rate and regular rhythm.     Pulses: Normal pulses.     Heart sounds: Normal heart sounds.  Pulmonary:     Effort: Pulmonary effort is normal.     Breath sounds:  Normal breath sounds.  Chest:  Breasts:    Tanner Score is 5.     Right: Normal.     Left: Normal.  Abdominal:     General: Abdomen is flat. Bowel sounds are normal.     Palpations: Abdomen is soft.     Hernia: There is no hernia in the left inguinal area or right inguinal area.  Genitourinary:    Exam position: Supine.     Pubic Area: No rash or pubic lice.      Tanner stage (genital): 5.     Labia:        Right: No tenderness.        Left: No tenderness.      Vagina: Normal.     Cervix: Friability present.     Uterus: Normal.      Adnexa: Right adnexa normal.     Rectum: Guaiac result negative. No mass.  Musculoskeletal:        General: Swelling present. Normal range of motion.     Cervical back: Normal range of motion and neck supple.     Comments: R knee swelling/crepitus  Lymphadenopathy:     Lower Body: No right inguinal adenopathy. No left inguinal adenopathy.  Skin:    General: Skin is warm and dry.  Neurological:     General: No focal deficit present.     Mental Status: She is alert and oriented to person, place, and time.  Psychiatric:        Mood and Affect: Mood normal.         Behavior: Behavior normal.         Assessment And Plan:     Encounter for general adult medical examination w/o abnormal findings Assessment & Plan: A full exam was performed.  Importance of monthly self breast exams was discussed with the patient.  She is advised to get 30-45 minutes of regular exercise, no less than four to five days per week. Both weight-bearing and aerobic exercises are recommended.  She is advised to follow a healthy diet with at least six fruits/veggies per day, decrease intake of red meat and other saturated fats and to increase fish intake to twice weekly.  Meats/fish should not be fried -- baked, boiled or broiled is preferable. It is also important to cut back on your sugar intake.  Be sure to read labels - try to avoid anything with added sugar, high fructose corn syrup or other sweeteners.  If you must use a sweetener, you can try stevia or monkfruit.  It is also important to avoid artificially sweetened foods/beverages and diet drinks. Lastly, wear SPF 50 sunscreen on exposed skin and when in direct sunlight for an extended period of time.  Be sure to avoid fast food restaurants and aim for at least 60 ounces of water daily.    - pap smear performed - Stool is heme negative.    Orders: -     CBC -     CMP14+EGFR -     Lipid panel -     Hemoglobin A1c -     Cytology - PAP -     POC Hemoccult Bld/Stl (1-Cd Office Dx)  Perimenopausal symptoms Assessment & Plan: She is still having cycles, but has lower body myalgias and feels that she has brain fog.  - consider Estroven OTC daily to help with symptoms.    Acute vaginitis -     NuSwab Vaginitis Plus (VG+)  Pain in both thighs -  ANA, IFA (with reflex) -     Sedimentation rate -     CK  Chronic pain of both knees Assessment & Plan: I will refer her to Ortho for further evaluation and radiographic studies.   Orders: -     Ambulatory referral to Orthopedic Surgery  Immunization due -     Flu  vaccine trivalent PF, 6mos and older(Flulaval,Afluria,Fluarix,Fluzone)  Family history of heart disease -     Lipoprotein A (LPA)   Return for 1 year HM. Patient was given opportunity to ask questions. Patient verbalized understanding of the plan and was able to repeat key elements of the plan. All questions were answered to their satisfaction.   I, Paula LOISE Slocumb, MD, have reviewed all documentation for this visit. The documentation on 06/12/24 for the exam, diagnosis, procedures, and orders are all accurate and complete.

## 2024-06-12 NOTE — Assessment & Plan Note (Signed)
 She is still having cycles, but has lower body myalgias and feels that she has brain fog.  - consider Estroven OTC daily to help with symptoms.

## 2024-06-12 NOTE — Assessment & Plan Note (Signed)
 I will refer her to Ortho for further evaluation and radiographic studies.

## 2024-06-13 LAB — LIPID PANEL
Chol/HDL Ratio: 2.6 ratio (ref 0.0–4.4)
Cholesterol, Total: 155 mg/dL (ref 100–199)
HDL: 59 mg/dL (ref >39)
LDL Chol Calc (NIH): 85 mg/dL (ref 0–99)
Triglycerides: 50 mg/dL (ref 0–149)
VLDL Cholesterol Cal: 11 mg/dL (ref 5–40)

## 2024-06-13 LAB — CBC
Hematocrit: 40 % (ref 34.0–46.6)
Hemoglobin: 13.2 g/dL (ref 11.1–15.9)
MCH: 32.4 pg (ref 26.6–33.0)
MCHC: 33 g/dL (ref 31.5–35.7)
MCV: 98 fL — ABNORMAL HIGH (ref 79–97)
Platelets: 350 x10E3/uL (ref 150–450)
RBC: 4.07 x10E6/uL (ref 3.77–5.28)
RDW: 11.5 % — ABNORMAL LOW (ref 11.7–15.4)
WBC: 5.6 x10E3/uL (ref 3.4–10.8)

## 2024-06-13 LAB — CK: Total CK: 158 U/L (ref 32–182)

## 2024-06-13 LAB — CMP14+EGFR
ALT: 18 IU/L (ref 0–32)
AST: 18 IU/L (ref 0–40)
Albumin: 4.2 g/dL (ref 3.9–4.9)
Alkaline Phosphatase: 102 IU/L (ref 41–116)
BUN/Creatinine Ratio: 13 (ref 9–23)
BUN: 9 mg/dL (ref 6–24)
Bilirubin Total: 0.3 mg/dL (ref 0.0–1.2)
CO2: 24 mmol/L (ref 20–29)
Calcium: 9.3 mg/dL (ref 8.7–10.2)
Chloride: 106 mmol/L (ref 96–106)
Creatinine, Ser: 0.71 mg/dL (ref 0.57–1.00)
Globulin, Total: 2.1 g/dL (ref 1.5–4.5)
Glucose: 102 mg/dL — ABNORMAL HIGH (ref 70–99)
Potassium: 4.6 mmol/L (ref 3.5–5.2)
Sodium: 142 mmol/L (ref 134–144)
Total Protein: 6.3 g/dL (ref 6.0–8.5)
eGFR: 104 mL/min/1.73 (ref >59)

## 2024-06-13 LAB — SEDIMENTATION RATE: Sed Rate: 2 mm/h (ref 0–40)

## 2024-06-13 LAB — HEMOGLOBIN A1C
Est. average glucose Bld gHb Est-mCnc: 117 mg/dL
Hgb A1c MFr Bld: 5.7 % — ABNORMAL HIGH (ref 4.8–5.6)

## 2024-06-13 LAB — ANTINUCLEAR ANTIBODIES, IFA: ANA Titer 1: POSITIVE — AB

## 2024-06-13 LAB — FANA STAINING PATTERNS: Nuclear Dot Pattern: 1:640 {titer} — ABNORMAL HIGH

## 2024-06-13 LAB — LIPOPROTEIN A (LPA): Lipoprotein (a): 64.1 nmol/L (ref <75.0)

## 2024-06-14 LAB — NUSWAB VAGINITIS PLUS (VG+)
Atopobium vaginae: HIGH {score} — AB
BVAB 2: HIGH {score} — AB
Candida albicans, NAA: NEGATIVE
Candida glabrata, NAA: NEGATIVE
Chlamydia trachomatis, NAA: NEGATIVE
Megasphaera 1: HIGH {score} — AB
Neisseria gonorrhoeae, NAA: NEGATIVE
Trich vag by NAA: NEGATIVE

## 2024-06-15 LAB — CYTOLOGY - PAP
Comment: NEGATIVE
Diagnosis: NEGATIVE
High risk HPV: NEGATIVE

## 2024-06-17 ENCOUNTER — Ambulatory Visit: Payer: Self-pay | Admitting: Internal Medicine

## 2024-06-17 ENCOUNTER — Other Ambulatory Visit: Payer: Self-pay | Admitting: Internal Medicine

## 2024-06-17 DIAGNOSIS — G8929 Other chronic pain: Secondary | ICD-10-CM

## 2024-06-17 DIAGNOSIS — R7689 Other specified abnormal immunological findings in serum: Secondary | ICD-10-CM

## 2024-06-17 DIAGNOSIS — M79651 Pain in right thigh: Secondary | ICD-10-CM

## 2024-06-17 MED ORDER — TINIDAZOLE 500 MG PO TABS: ORAL_TABLET | ORAL | 0 refills | Status: AC

## 2024-06-20 ENCOUNTER — Other Ambulatory Visit (INDEPENDENT_AMBULATORY_CARE_PROVIDER_SITE_OTHER)

## 2024-06-20 ENCOUNTER — Ambulatory Visit (INDEPENDENT_AMBULATORY_CARE_PROVIDER_SITE_OTHER): Admitting: Orthopaedic Surgery

## 2024-06-20 ENCOUNTER — Other Ambulatory Visit: Payer: Self-pay

## 2024-06-20 ENCOUNTER — Encounter: Payer: Self-pay | Admitting: Orthopaedic Surgery

## 2024-06-20 DIAGNOSIS — M25561 Pain in right knee: Secondary | ICD-10-CM | POA: Diagnosis not present

## 2024-06-20 DIAGNOSIS — M170 Bilateral primary osteoarthritis of knee: Secondary | ICD-10-CM | POA: Diagnosis not present

## 2024-06-20 DIAGNOSIS — M25562 Pain in left knee: Secondary | ICD-10-CM | POA: Diagnosis not present

## 2024-06-20 DIAGNOSIS — G8929 Other chronic pain: Secondary | ICD-10-CM | POA: Diagnosis not present

## 2024-06-20 DIAGNOSIS — M1711 Unilateral primary osteoarthritis, right knee: Secondary | ICD-10-CM

## 2024-06-20 DIAGNOSIS — M1712 Unilateral primary osteoarthritis, left knee: Secondary | ICD-10-CM

## 2024-06-20 MED ORDER — METHYLPREDNISOLONE ACETATE 40 MG/ML IJ SUSP
40.0000 mg | INTRAMUSCULAR | Status: AC | PRN
  Administered 2024-06-20: 40 mg via INTRA_ARTICULAR

## 2024-06-20 MED ORDER — BUPIVACAINE HCL 0.5 % IJ SOLN
2.0000 mL | INTRAMUSCULAR | Status: AC | PRN
  Administered 2024-06-20: 2 mL via INTRA_ARTICULAR

## 2024-06-20 MED ORDER — LIDOCAINE HCL 1 % IJ SOLN
2.0000 mL | INTRAMUSCULAR | Status: AC | PRN
  Administered 2024-06-20: 2 mL

## 2024-06-20 NOTE — Progress Notes (Signed)
 Office Visit Note   Patient: [name]           Date of Birth: 10/01/1975           MRN: [id number] Visit Date: 06/20/2024              Requested by: Jarold Medici, MD [address] [place],  [place] [address] PCP: Jarold Medici, MD   Assessment & Plan: Visit Diagnoses:  1. Primary osteoarthritis of right knee   2. Primary osteoarthritis of left knee     Plan: Patient struggling with primarily bilateral  patellofemoral knee arthritis.  Requesting repeat cortisone injection today as she has gotten 6 months - 12 months of relief after injections in the past.  Cortisone injections performed and patient tolerated procedure well.  See procedure note below.  Patient will also work on home exercise program to focus on quadriceps/VMO and hip abductor strengthening to assist with proper patellar tracking.  Will return to clinic for repeat evaluation once symptoms return.  Patient agrees to treatment plan.  No further questions or concerns at this time.  Follow-Up Instructions: No follow-ups on file.   Orders:  Orders Placed This Encounter  Procedures   Large Joint Inj   XR KNEE 3 VIEW LEFT   XR KNEE 3 VIEW RIGHT   No orders of the defined types were placed in this encounter.     Procedures: Large Joint Inj: bilateral knee on 06/20/2024 12:55 PM Indications: pain Details: 22 G needle  Arthrogram: No  Medications (Right): 2 mL lidocaine  1 %; 2 mL bupivacaine 0.5 %; 40 mg methylPREDNISolone acetate 40 MG/ML Medications (Left): 2 mL lidocaine  1 %; 2 mL bupivacaine 0.5 %; 40 mg methylPREDNISolone acetate 40 MG/ML Outcome: tolerated well, no immediate complications Patient was prepped and draped in the usual sterile fashion.       Clinical Data: No additional findings.   Subjective: Chief Complaint  Patient presents with   Left Knee - Pain   Right Knee - Pain    HPI  Patient is a 48 year old female who was referred to us  by her PCP earlier this month  for management of chronic knee pain.  Patient does not have any other significant orthopedic history.  Patient says knee pain has been ongoing for roughly 10 years.  She endorses worse pain with squatting or climbing stairs.  Pain is primary located at superior pole of her kneecaps.  Endorses some clicking popping occasionally.  Denies any traumatic injuries in the past.  Has received roughly 3 rounds of cortisone injection in the each last roughly 1 year.  Here today requesting repeat bilateral injections if appropriate.  Review of Systems  Constitutional: Negative.   HENT: Negative.    Eyes: Negative.   Respiratory: Negative.    Cardiovascular: Negative.   Endocrine: Negative.   Musculoskeletal: Negative.   Neurological: Negative.   Hematological: Negative.   Psychiatric/Behavioral: Negative.    All other systems reviewed and are negative.   ROS negative except as noted in HPI above   Objective: Vital Signs: LMP 06/05/2024 (Exact Date)   Physical Exam Vitals and nursing note reviewed.  Constitutional:      Appearance: She is well-developed.  HENT:     Head: Atraumatic.     Nose: Nose normal.  Eyes:     Extraocular Movements: Extraocular movements intact.  Cardiovascular:     Pulses: Normal pulses.  Pulmonary:     Effort: Pulmonary effort is normal.  Abdominal:  Palpations: Abdomen is soft.  Musculoskeletal:     Cervical back: Neck supple.  Skin:    General: Skin is warm.     Capillary Refill: Capillary refill takes less than 2 seconds.  Neurological:     Mental Status: She is alert. Mental status is at baseline.  Psychiatric:        Behavior: Behavior normal.        Thought Content: Thought content normal.        Judgment: Judgment normal.     Ortho Exam On examination of patient's knee no signs of erythema, ecchymoses.  Mild knee effusion bilaterally palpated on exam.  Denies any significant tenderness to palpation over joint line.  Some pain with palpation  over superior patellar pole.  Patient has full active and passive range of motion. Strength 5/5 in all planes of motion.  Patient neurovascularly intact distally.  Negative anterior/posterior drawer test.  Negative Lachman's.  Negative varus valgus stress testing.  Negative McMurray's test.  Positive patellofemoral grind test.  Positive J sign bilaterally.  Specialty Comments:  No specialty comments available.  Imaging: XR KNEE 3 VIEW RIGHT Result Date: 06/20/2024 Evidence of knee osteoarthritis with majority of osteoarthritis occurring in patellofemoral compartment.  Mild patella alta present.  Possible mild femoral anteversion.  No evidence of fractures or other acute osseous abnormalities.  XR KNEE 3 VIEW LEFT Result Date: 06/20/2024 Evidence of knee osteoarthritis with majority of osteoarthritis occurring in patellofemoral compartment.  Mild patella alta present.  Possible mild femoral anteversion.  No evidence of fractures or other acute osseous abnormalities.    PMFS History: Patient Active Problem List   Diagnosis Date Noted   Chronic pain of both knees 06/12/2024   Encounter for general adult medical examination w/o abnormal findings 06/09/2023   History of food allergy  06/09/2023   Adjustment disorder with depressed mood 06/09/2023   Perimenopausal symptoms 05/17/2021   Primary insomnia 05/17/2021   Vitamin D deficiency 05/17/2021   Other abnormal glucose 05/17/2021   Acute non-recurrent frontal sinusitis 12/18/2018   Pleurisy 10/10/2018   Lower respiratory infection 10/10/2018   Cough 10/10/2018   Past Medical History:  Diagnosis Date   Asthma    COVID-19    Eczema     Family History  Problem Relation Age of Onset   Allergic rhinitis Mother    Eczema Mother    Allergic rhinitis Father    Diabetes Father    Eczema Sister    Allergic rhinitis Sister    Diabetes Brother     Past Surgical History:  Procedure Laterality Date   CESAREAN SECTION  2001   TENDON  REPAIR  2010   Social History   Occupational History   Not on file  Tobacco Use   Smoking status: Former    Types: Cigarettes    Passive exposure: Current   Smokeless tobacco: Never  Substance and Sexual Activity   Alcohol use: Yes    Comment: sometimes   Drug use: Never   Sexual activity: Yes

## 2024-07-20 ENCOUNTER — Other Ambulatory Visit

## 2024-07-20 DIAGNOSIS — M79652 Pain in left thigh: Secondary | ICD-10-CM | POA: Diagnosis not present

## 2024-07-20 DIAGNOSIS — R7689 Other specified abnormal immunological findings in serum: Secondary | ICD-10-CM

## 2024-07-20 DIAGNOSIS — M79651 Pain in right thigh: Secondary | ICD-10-CM

## 2024-07-22 LAB — ANTI-SMOOTH MUSCLE ANTIBODY, IGG: Smooth Muscle Ab: 7 U (ref 0–19)

## 2024-07-22 LAB — MITOCHONDRIAL ANTIBODIES: Mitochondrial Ab: 30.4 U — ABNORMAL HIGH (ref 0.0–20.0)

## 2024-07-23 ENCOUNTER — Ambulatory Visit: Payer: Self-pay | Admitting: Internal Medicine

## 2024-10-02 ENCOUNTER — Ambulatory Visit: Admitting: Physician Assistant

## 2025-06-18 ENCOUNTER — Encounter: Admitting: Internal Medicine
